# Patient Record
Sex: Male | Born: 1935 | Race: White | Hispanic: No | Marital: Married | Smoking: Never smoker
Health system: Southern US, Community
[De-identification: ages and names within clinical notes are randomized; demographics above are authoritative.]

## PROBLEM LIST (undated history)

## (undated) DIAGNOSIS — I10 Essential (primary) hypertension: Secondary | ICD-10-CM

## (undated) HISTORY — DX: Essential (primary) hypertension: I10

---

## 2014-12-25 DIAGNOSIS — I10 Essential (primary) hypertension: Secondary | ICD-10-CM | POA: Diagnosis not present

## 2015-06-26 DIAGNOSIS — Z79899 Other long term (current) drug therapy: Secondary | ICD-10-CM | POA: Diagnosis not present

## 2015-06-26 DIAGNOSIS — Z125 Encounter for screening for malignant neoplasm of prostate: Secondary | ICD-10-CM | POA: Diagnosis not present

## 2015-06-26 DIAGNOSIS — I10 Essential (primary) hypertension: Secondary | ICD-10-CM | POA: Diagnosis not present

## 2016-10-04 ENCOUNTER — Encounter: Payer: Self-pay | Admitting: General Practice

## 2016-11-22 ENCOUNTER — Ambulatory Visit: Payer: Self-pay | Admitting: Family Medicine

## 2016-12-19 ENCOUNTER — Encounter: Payer: Self-pay | Admitting: Family Medicine

## 2016-12-19 ENCOUNTER — Ambulatory Visit (INDEPENDENT_AMBULATORY_CARE_PROVIDER_SITE_OTHER): Payer: Medicare HMO | Admitting: Family Medicine

## 2016-12-19 VITALS — BP 121/79 | HR 79 | Temp 97.9°F | Resp 16 | Ht 72.75 in | Wt 192.1 lb

## 2016-12-19 DIAGNOSIS — Z125 Encounter for screening for malignant neoplasm of prostate: Secondary | ICD-10-CM | POA: Diagnosis not present

## 2016-12-19 DIAGNOSIS — E663 Overweight: Secondary | ICD-10-CM

## 2016-12-19 DIAGNOSIS — Z1211 Encounter for screening for malignant neoplasm of colon: Secondary | ICD-10-CM | POA: Diagnosis not present

## 2016-12-19 DIAGNOSIS — I10 Essential (primary) hypertension: Secondary | ICD-10-CM | POA: Diagnosis not present

## 2016-12-19 LAB — BASIC METABOLIC PANEL
BUN: 30 mg/dL — ABNORMAL HIGH (ref 6–23)
CALCIUM: 9.3 mg/dL (ref 8.4–10.5)
CO2: 29 meq/L (ref 19–32)
Chloride: 104 mEq/L (ref 96–112)
Creatinine, Ser: 1.12 mg/dL (ref 0.40–1.50)
GFR: 66.81 mL/min (ref >60.00)
Glucose, Bld: 80 mg/dL (ref 70–99)
POTASSIUM: 5 meq/L (ref 3.5–5.1)
SODIUM: 138 meq/L (ref 135–145)

## 2016-12-19 LAB — LIPID PANEL
Cholesterol: 216 mg/dL — ABNORMAL HIGH (ref 0–200)
HDL: 45.7 mg/dL (ref >39.00)
LDL Cholesterol: 138 mg/dL — ABNORMAL HIGH (ref 0–99)
NONHDL: 170.72
Total CHOL/HDL Ratio: 5
Triglycerides: 164 mg/dL — ABNORMAL HIGH (ref 0.0–149.0)
VLDL: 32.8 mg/dL (ref 0.0–40.0)

## 2016-12-19 LAB — HEPATIC FUNCTION PANEL
ALT: 13 U/L (ref 0–53)
AST: 15 U/L (ref 0–37)
Albumin: 4.3 g/dL (ref 3.5–5.2)
Alkaline Phosphatase: 67 U/L (ref 39–117)
BILIRUBIN DIRECT: 0.1 mg/dL (ref 0.0–0.3)
BILIRUBIN TOTAL: 0.4 mg/dL (ref 0.2–1.2)
Total Protein: 7.1 g/dL (ref 6.0–8.3)

## 2016-12-19 LAB — CBC WITH DIFFERENTIAL/PLATELET
Basophils Absolute: 0 10*3/uL (ref 0.0–0.1)
Basophils Relative: 0.6 % (ref 0.0–3.0)
Eosinophils Absolute: 0.2 10*3/uL (ref 0.0–0.7)
Eosinophils Relative: 3.6 % (ref 0.0–5.0)
HEMATOCRIT: 43.7 % (ref 39.0–52.0)
Hemoglobin: 14.3 g/dL (ref 13.0–17.0)
LYMPHS PCT: 31.9 % (ref 12.0–46.0)
Lymphs Abs: 1.7 10*3/uL (ref 0.7–4.0)
MCHC: 32.8 g/dL (ref 30.0–36.0)
MCV: 89.7 fl (ref 78.0–100.0)
MONOS PCT: 8.5 % (ref 3.0–12.0)
Monocytes Absolute: 0.5 10*3/uL (ref 0.1–1.0)
NEUTROS ABS: 3 10*3/uL (ref 1.4–7.7)
Neutrophils Relative %: 55.4 % (ref 43.0–77.0)
PLATELETS: 224 10*3/uL (ref 150.0–400.0)
RBC: 4.87 Mil/uL (ref 4.22–5.81)
RDW: 13.7 % (ref 11.5–15.5)
WBC: 5.3 10*3/uL (ref 4.0–10.5)

## 2016-12-19 LAB — TSH: TSH: 1.84 u[IU]/mL (ref 0.35–4.50)

## 2016-12-19 LAB — PSA, MEDICARE: PSA: 4.11 ng/ml — ABNORMAL HIGH (ref 0.10–4.00)

## 2016-12-19 NOTE — Assessment & Plan Note (Signed)
New to provider, ongoing for pt.  BMI is just over 25.  Exercising regularly.  Not following particular diet.  Check labs to risk stratify.  Will follow.

## 2016-12-19 NOTE — Progress Notes (Signed)
   Subjective:    Patient ID: Jack Randolph, male    DOB: 1932-09-14, 83 y.o.   MRN: [id number]  HPI New to establish.  Previous MD- Deboraha Sprang, Cammie Fulp  HTN- chronic problem, currently on Ramipril.  Has not been seen in 2 yrs.  BP is well controlled.  No CP, SOB, HAs, visual changes, edema.  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  Goes to gym ([employer]) 3x/week.  Not following any particular diet.  Health Maintenance- pt has never had a colonoscopy and is not interested in having one.  Pt is open to the idea of cologuard.  Pt reports he has had both Pneumonia vaccines at [facility].     Review of Systems For ROS see HPI     Objective:   Physical Exam  Constitutional: He is oriented to person, place, and time. He appears well-developed and well-nourished. No distress.  HENT:  Head: Normocephalic and atraumatic.  Eyes: Conjunctivae and EOM are normal. Pupils are equal, round, and reactive to light.  Neck: Normal range of motion. Neck supple. No thyromegaly present.  Cardiovascular: Normal rate, regular rhythm, normal heart sounds and intact distal pulses.   No murmur heard. Pulmonary/Chest: Effort normal and breath sounds normal. No respiratory distress.  Abdominal: Soft. Bowel sounds are normal. He exhibits no distension.  Musculoskeletal: He exhibits edema (trace edema of LEs bilaterally).  Lymphadenopathy:    He has no cervical adenopathy.  Neurological: He is alert and oriented to person, place, and time. No cranial nerve deficit.  Skin: Skin is warm and dry.  Psychiatric: He has a normal mood and affect. His behavior is normal.  Vitals reviewed.         Assessment & Plan:

## 2016-12-19 NOTE — Patient Instructions (Signed)
Schedule your complete physical in 6 months and your Medicare Wellness Visit w/ Jack Randolph around the same time [facility] notify you of your lab results and make any changes if needed Keep up the good work on healthy diet and regular exercise- you look great! Complete the Cologuard as directed Call with any questions or concerns Welcome!  We're glad to have you!!!

## 2016-12-19 NOTE — Assessment & Plan Note (Signed)
New to provider, ongoing for pt.  Well controlled on Ramipril.  Asymptomatic w/ exception of chronic LE edema.  Check labs.  No anticipated med changes.  Will follow.

## 2016-12-19 NOTE — Progress Notes (Signed)
Pre visit review using our clinic review tool, if applicable. No additional management support is needed unless otherwise documented below in the visit note. 

## 2016-12-19 NOTE — Assessment & Plan Note (Signed)
New.  Pt is not interested in colonoscopy but states he would pursue treatment if there was something found.  Will do cologuard instead.  Pt expressed understanding and is in agreement w/ plan.

## 2016-12-20 ENCOUNTER — Other Ambulatory Visit: Payer: Self-pay | Admitting: Family Medicine

## 2016-12-20 DIAGNOSIS — R972 Elevated prostate specific antigen [PSA]: Secondary | ICD-10-CM

## 2017-01-01 DIAGNOSIS — Z1212 Encounter for screening for malignant neoplasm of rectum: Secondary | ICD-10-CM | POA: Diagnosis not present

## 2017-01-01 DIAGNOSIS — Z1211 Encounter for screening for malignant neoplasm of colon: Secondary | ICD-10-CM | POA: Diagnosis not present

## 2017-01-03 LAB — COLOGUARD: COLOGUARD: NEGATIVE

## 2017-01-10 ENCOUNTER — Encounter: Payer: Self-pay | Admitting: General Practice

## 2017-01-31 ENCOUNTER — Other Ambulatory Visit: Payer: Self-pay | Admitting: Emergency Medicine

## 2017-01-31 ENCOUNTER — Telehealth: Payer: Self-pay | Admitting: Family Medicine

## 2017-01-31 MED ORDER — RAMIPRIL 10 MG PO CAPS: 10.0000 mg | ORAL_CAPSULE | Freq: Every day | ORAL | 1 refills | Status: DC

## 2017-01-31 NOTE — Telephone Encounter (Signed)
Rx sent to the preferred patient pharmacy  

## 2017-01-31 NOTE — Telephone Encounter (Signed)
Pt needs refill on ramipril 10mg  capsule, [facility] on [address].

## 2017-02-27 DIAGNOSIS — N5201 Erectile dysfunction due to arterial insufficiency: Secondary | ICD-10-CM | POA: Diagnosis not present

## 2017-02-27 DIAGNOSIS — N400 Enlarged prostate without lower urinary tract symptoms: Secondary | ICD-10-CM | POA: Diagnosis not present

## 2017-06-13 NOTE — Progress Notes (Addendum)
Subjective:   Jack Randolph is a 83 y.o. male who presents for an Initial Medicare Annual Wellness Visit.  Review of Systems  No ROS.  Medicare Wellness Visit. Additional risk factors are reflected in the social history.  Cardiac Risk Factors include: advanced age (>89men, >3 women);hypertension;male gender   Sleep patterns: Sleeps 11 hours.  Home Safety/Smoke Alarms: Feels safe in home. Smoke alarms in place.  Living environment; residence and Firearm Safety: Lives with wife in 2 story home.  Seat Belt Safety/Bike Helmet: Wears seat belt.    Male:   CCS-Cologuard 01/03/17, negative.      PSA-Followed by [facility] per patient Lab Results  Component Value Date   PSA 4.11 (H) 12/19/2016      Objective:    Today's Vitals   06/14/17 0818  BP: 121/80  Pulse: (!) 57  Resp: 16  Temp: 98 F (36.7 C)  TempSrc: Oral  SpO2: 98%  Weight: 188 lb (85.3 kg)  Height: 6' (1.829 m)   Body mass index is 25.5 kg/m.  Current Medications (verified) Outpatient Encounter Prescriptions as of 06/14/2017  Medication Sig  . Coenzyme Q10 (CO Q 10) 100 MG CAPS Take by mouth.  Boris Lown Oil (OMEGA-3) 500 MG CAPS Take by mouth.  . Multiple Vitamins-Minerals (MULTIVITAMIN ADULTS PO) Take by mouth.  . ramipril (ALTACE) 10 MG capsule Take 1 capsule (10 mg total) by mouth daily.   No facility-administered encounter medications on file as of 06/14/2017.     Allergies (verified) Patient has no known allergies.   History: Past Medical History:  Diagnosis Date  . Hypertension    History reviewed. No pertinent surgical history. Family History  Problem Relation Age of Onset  . Healthy Mother   . COPD Father    Social History   Occupational History  . Holiday representative    Social History Main Topics  . Smoking status: Never Smoker  . Smokeless tobacco: Never Used  . Alcohol use Yes  . Drug use: No  . Sexual activity: Not on file   Tobacco Counseling Counseling given:  Yes   Activities of Daily Living In your present state of health, do you have any difficulty performing the following activities: 06/14/2017 06/14/2017  Hearing? N N  Vision? N N  Difficulty concentrating or making decisions? N N  Walking or climbing stairs? N N  Dressing or bathing? N N  Doing errands, shopping? N N  Preparing Food and eating ? N -  Using the Toilet? N -  In the past six months, have you accidently leaked urine? N -  Do you have problems with loss of bowel control? N -  Managing your Medications? N -  Managing your Finances? N -  Housekeeping or managing your Housekeeping? N -  Some recent data might be hidden    Immunizations and Health Maintenance Immunization History  Administered Date(s) Administered  . Influenza,inj,Quad PF,6+ Mos 04/11/2017, 04/11/2017  . Pneumococcal Conjugate-13 04/17/2015  . Pneumococcal Polysaccharide-23 03/22/2016   There are no preventive care reminders to display for this patient.  Patient Care Team: Sheliah Hatch, MD as PCP - General (Family Medicine) [facility], [facility]  Indicate any recent Medical Services you may have received from other than [facility] providers in the past year (date may be approximate).    Assessment:   This is a routine wellness examination for Jack Randolph. Physical assessment deferred to PCP.   Hearing/Vision screen Hearing Screening Comments: Able to hear conversational tones w/o  difficulty. No issues reported.   Vision Screening Comments: Last exam > 4 years, wears glasses.   Dietary issues and exercise activities discussed: Current Exercise Habits: Home exercise routine, Type of exercise: walking, Time (Minutes): 50, Frequency (Times/Week): 2, Weekly Exercise (Minutes/Week): 100, Exercise limited by: None identified   Diet (meal preparation, eat out, water intake, caffeinated beverages, dairy products, fruits and vegetables): Drinks water and juice.   Breakfast: eggs, juice Lunch:  casseroles; protein and vegetables.  Dinner: protein and vegetables.   Goals    . patient          Maintain current health by staying active.       Depression Screen PHQ 2/9 Scores 06/14/2017 06/14/2017 12/19/2016  PHQ - 2 Score 0 0 0  PHQ- 9 Score - 0 0    Fall Risk Fall Risk  06/14/2017 06/14/2017 12/19/2016  Falls in the past year? No No No    Cognitive Function: MMSE - Mini Mental State Exam 06/14/2017  Orientation to time 5  Orientation to Place 5  Registration 3  Attention/ Calculation 3  Recall 3  Language- name 2 objects 2  Language- repeat 1  Language- follow 3 step command 3  Language- read & follow direction 1  Write a sentence 1  Copy design 1  Total score 28        Screening Tests Health Maintenance  Topic Date Due  . TETANUS/TDAP  11/20/2017 (Originally 07/27/1952)  . INFLUENZA VACCINE  Completed  . PNA vac Low Risk Adult  Completed        Plan:    Bring a copy of your living will and/or healthcare power of attorney to your next office visit.  Continue doing brain stimulating activities (puzzles, reading, adult coloring books, staying active) to keep memory sharp.   I have personally reviewed and noted the following in the patient's chart:   . Medical and social history . Use of alcohol, tobacco or illicit drugs  . Current medications and supplements . Functional ability and status . Nutritional status . Physical activity . Advanced directives . List of other physicians . Hospitalizations, surgeries, and ER visits in previous 12 months . Vitals . Screenings to include cognitive, depression, and falls . Referrals and appointments  In addition, I have reviewed and discussed with patient certain preventive protocols, quality metrics, and best practice recommendations. A written personalized care plan for preventive services as well as general preventive health recommendations were provided to patient.     Alysia PennaKimberly R Kateland Leisinger,  RN   06/14/2017   PCP Notes: -Declines Shingrix at this time -MMSE=28  Reviewed documentation and agree w/ above.  Neena RhymesKatherine Tabori, MD

## 2017-06-14 ENCOUNTER — Encounter: Payer: Self-pay | Admitting: Family Medicine

## 2017-06-14 ENCOUNTER — Ambulatory Visit (INDEPENDENT_AMBULATORY_CARE_PROVIDER_SITE_OTHER): Payer: Medicare HMO | Admitting: Family Medicine

## 2017-06-14 VITALS — BP 121/80 | HR 57 | Temp 98.0°F | Resp 16 | Ht 72.0 in | Wt 188.0 lb

## 2017-06-14 DIAGNOSIS — Z0000 Encounter for general adult medical examination without abnormal findings: Secondary | ICD-10-CM | POA: Diagnosis not present

## 2017-06-14 DIAGNOSIS — I10 Essential (primary) hypertension: Secondary | ICD-10-CM

## 2017-06-14 DIAGNOSIS — Z125 Encounter for screening for malignant neoplasm of prostate: Secondary | ICD-10-CM

## 2017-06-14 LAB — CBC WITH DIFFERENTIAL/PLATELET
BASOS ABS: 0 10*3/uL (ref 0.0–0.1)
Basophils Relative: 0.6 % (ref 0.0–3.0)
Eosinophils Absolute: 0.2 10*3/uL (ref 0.0–0.7)
Eosinophils Relative: 3.7 % (ref 0.0–5.0)
HCT: 42 % (ref 39.0–52.0)
Hemoglobin: 14.2 g/dL (ref 13.0–17.0)
LYMPHS ABS: 1.9 10*3/uL (ref 0.7–4.0)
Lymphocytes Relative: 34.5 % (ref 12.0–46.0)
MCHC: 33.7 g/dL (ref 30.0–36.0)
MCV: 89.7 fl (ref 78.0–100.0)
MONO ABS: 0.4 10*3/uL (ref 0.1–1.0)
Monocytes Relative: 8 % (ref 3.0–12.0)
NEUTROS ABS: 2.9 10*3/uL (ref 1.4–7.7)
NEUTROS PCT: 53.2 % (ref 43.0–77.0)
Platelets: 215 10*3/uL (ref 150.0–400.0)
RBC: 4.68 Mil/uL (ref 4.22–5.81)
RDW: 13.5 % (ref 11.5–15.5)
WBC: 5.4 10*3/uL (ref 4.0–10.5)

## 2017-06-14 LAB — LIPID PANEL
CHOLESTEROL: 243 mg/dL — AB (ref 0–200)
HDL: 48.9 mg/dL (ref >39.00)
LDL CALC: 165 mg/dL — AB (ref 0–99)
NonHDL: 193.93
TRIGLYCERIDES: 145 mg/dL (ref 0.0–149.0)
Total CHOL/HDL Ratio: 5
VLDL: 29 mg/dL (ref 0.0–40.0)

## 2017-06-14 LAB — BASIC METABOLIC PANEL
BUN: 30 mg/dL — ABNORMAL HIGH (ref 6–23)
CHLORIDE: 103 meq/L (ref 96–112)
CO2: 30 meq/L (ref 19–32)
Calcium: 9.2 mg/dL (ref 8.4–10.5)
Creatinine, Ser: 0.91 mg/dL (ref 0.40–1.50)
GFR: 84.8 mL/min (ref >60.00)
GLUCOSE: 99 mg/dL (ref 70–99)
POTASSIUM: 4.6 meq/L (ref 3.5–5.1)
Sodium: 139 mEq/L (ref 135–145)

## 2017-06-14 LAB — HEPATIC FUNCTION PANEL
ALK PHOS: 70 U/L (ref 39–117)
ALT: 12 U/L (ref 0–53)
AST: 14 U/L (ref 0–37)
Albumin: 4.1 g/dL (ref 3.5–5.2)
Bilirubin, Direct: 0.1 mg/dL (ref 0.0–0.3)
TOTAL PROTEIN: 6.8 g/dL (ref 6.0–8.3)
Total Bilirubin: 0.5 mg/dL (ref 0.2–1.2)

## 2017-06-14 LAB — TSH: TSH: 1.45 u[IU]/mL (ref 0.35–4.50)

## 2017-06-14 LAB — PSA, MEDICARE: PSA: 4.16 ng/ml — ABNORMAL HIGH (ref 0.10–4.00)

## 2017-06-14 NOTE — Progress Notes (Signed)
Called pt and lmovm to return call.

## 2017-06-14 NOTE — Progress Notes (Signed)
   Subjective:    Patient ID: Jack Randolph, male    DOB: 03/17/1933, 83 y.o.   MRN: [id number]  HPI CPE- UTD on flu shot, pneumonia vaccines.  UTD on cologuard.  Pt has gained 5 lbs from last visit.   Review of Systems Patient reports no vision/hearing changes, anorexia, fever ,adenopathy, persistant/recurrent hoarseness, swallowing issues, chest pain, palpitations, edema, persistant/recurrent cough, hemoptysis, dyspnea (rest,exertional, paroxysmal nocturnal), gastrointestinal  bleeding (melena, rectal bleeding), abdominal pain, excessive heart burn, GU symptoms (dysuria, hematuria, voiding/incontinence issues) syncope, focal weakness, memory loss, numbness & tingling, skin/hair/nail changes, depression, anxiety, abnormal bruising/bleeding, musculoskeletal symptoms/signs.     Objective:   Physical Exam General Appearance:    Alert, cooperative, no distress, appears stated age  Head:    Normocephalic, without obvious abnormality, atraumatic  Eyes:    PERRL, conjunctiva/corneas clear, EOM's intact, fundi    benign, both eyes       Ears:    Normal TM's and external ear canals, both ears  Nose:   Nares normal, septum midline, mucosa normal, no drainage   or sinus tenderness  Throat:   Lips, mucosa, and tongue normal; teeth and gums normal  Neck:   Supple, symmetrical, trachea midline, no adenopathy;       thyroid:  No enlargement/tenderness/nodules  Back:     Symmetric, no curvature, ROM normal, no CVA tenderness  Lungs:     Clear to auscultation bilaterally, respirations unlabored  Chest wall:    No tenderness or deformity  Heart:    Regular rate and rhythm, S1 and S2 normal, no murmur, rub   or gallop  Abdomen:     Soft, non-tender, bowel sounds active all four quadrants,    no masses, no organomegaly  Genitalia:    deferred  Rectal:    Extremities:   Extremities normal, atraumatic, no cyanosis or edema  Pulses:   2+ and symmetric all extremities  Skin:   Skin color, texture,  turgor normal, no rashes or lesions  Lymph nodes:   Cervical, supraclavicular, and axillary nodes normal  Neurologic:   CNII-XII intact. Normal strength, sensation and reflexes      throughout          Assessment & Plan:

## 2017-06-14 NOTE — Assessment & Plan Note (Signed)
Pt's PE WNL.  UTD on cologuard, immunizations.  Check PSA today.  Check labs.  Anticipatory guidance provided.

## 2017-06-14 NOTE — Assessment & Plan Note (Signed)
Chronic problem.  Adequate control.  Asymptomatic.  Check labs.  Will continue to follow.

## 2017-06-14 NOTE — Patient Instructions (Addendum)
Follow up in 6 months to recheck BP We'll notify you of your lab results and make any changes if needed Continue to work on healthy diet and regular exercise- you look great! Call with any questions or concerns Happy Fall!!!  Bring a copy of your living will and/or healthcare power of attorney to your next office visit.  Continue doing brain stimulating activities (puzzles, reading, adult coloring books, staying active) to keep memory sharp.    Health Maintenance, Male A healthy lifestyle and preventive care is important for your health and wellness. Ask your health care provider about what schedule of regular examinations is right for you. What should I know about weight and diet? Eat a Healthy Diet  Eat plenty of vegetables, fruits, whole grains, low-fat dairy products, and lean protein.  Do not eat a lot of foods high in solid fats, added sugars, or salt.  Maintain a Healthy Weight Regular exercise can help you achieve or maintain a healthy weight. You should:  Do at least 150 minutes of exercise each week. The exercise should increase your heart rate and make you sweat (moderate-intensity exercise).  Do strength-training exercises at least twice a week.  Watch Your Levels of Cholesterol and Blood Lipids  Have your blood tested for lipids and cholesterol every 5 years starting at 81 years of age. If you are at high risk for heart disease, you should start having your blood tested when you are 81 years old. You may need to have your cholesterol levels checked more often if: ? Your lipid or cholesterol levels are high. ? You are older than 81 years of age. ? You are at high risk for heart disease.  What should I know about cancer screening? Many types of cancers can be detected early and may often be prevented. Lung Cancer  You should be screened every year for lung cancer if: ? You are a current smoker who has smoked for at least 30 years. ? You are a former smoker who has  quit within the past 15 years.  Talk to your health care provider about your screening options, when you should start screening, and how often you should be screened.  Colorectal Cancer  Routine colorectal cancer screening usually begins at 81 years of age and should be repeated every 5-10 years until you are 81 years old. You may need to be screened more often if early forms of precancerous polyps or small growths are found. Your health care provider may recommend screening at an earlier age if you have risk factors for colon cancer.  Your health care provider may recommend using home test kits to check for hidden blood in the stool.  A small camera at the end of a tube can be used to examine your colon (sigmoidoscopy or colonoscopy). This checks for the earliest forms of colorectal cancer.  Prostate and Testicular Cancer  Depending on your age and overall health, your health care provider may do certain tests to screen for prostate and testicular cancer.  Talk to your health care provider about any symptoms or concerns you have about testicular or prostate cancer.  Skin Cancer  Check your skin from head to toe regularly.  Tell your health care provider about any new moles or changes in moles, especially if: ? There is a change in a mole's size, shape, or color. ? You have a mole that is larger than a pencil eraser.  Always use sunscreen. Apply sunscreen liberally and repeat throughout the day.  Protect yourself by wearing long sleeves, pants, a wide-brimmed hat, and sunglasses when outside.  What should I know about heart disease, diabetes, and high blood pressure?  If you are 53-44 years of age, have your blood pressure checked every 3-5 years. If you are 70 years of age or older, have your blood pressure checked every year. You should have your blood pressure measured twice-once when you are at a hospital or clinic, and once when you are not at a hospital or clinic. Record the  average of the two measurements. To check your blood pressure when you are not at a hospital or clinic, you can use: ? An automated blood pressure machine at a pharmacy. ? A home blood pressure monitor.  Talk to your health care provider about your target blood pressure.  If you are between 96-96 years old, ask your health care provider if you should take aspirin to prevent heart disease.  Have regular diabetes screenings by checking your fasting blood sugar level. ? If you are at a normal weight and have a low risk for diabetes, have this test once every three years after the age of 87. ? If you are overweight and have a high risk for diabetes, consider being tested at a younger age or more often.  A one-time screening for abdominal aortic aneurysm (AAA) by ultrasound is recommended for men aged 70-75 years who are current or former smokers. What should I know about preventing infection? Hepatitis B If you have a higher risk for hepatitis B, you should be screened for this virus. Talk with your health care provider to find out if you are at risk for hepatitis B infection. Hepatitis C Blood testing is recommended for:  Everyone born from [birth year] through 1963.  Anyone with known risk factors for hepatitis C.  Sexually Transmitted Diseases (STDs)  You should be screened each year for STDs including gonorrhea and chlamydia if: ? You are sexually active and are younger than 81 years of age. ? You are older than 81 years of age and your health care provider tells you that you are at risk for this type of infection. ? Your sexual activity has changed since you were last screened and you are at an increased risk for chlamydia or gonorrhea. Ask your health care provider if you are at risk.  Talk with your health care provider about whether you are at high risk of being infected with HIV. Your health care provider may recommend a prescription medicine to help prevent HIV infection.  What else can  I do?  Schedule regular health, dental, and eye exams.  Stay current with your vaccines (immunizations).  Do not use any tobacco products, such as cigarettes, chewing tobacco, and e-cigarettes. If you need help quitting, ask your health care provider.  Limit alcohol intake to no more than 2 drinks per day. One drink equals 12 ounces of beer, 5 ounces of wine, or 1 ounces of hard liquor.  Do not use street drugs.  Do not share needles.  Ask your health care provider for help if you need support or information about quitting drugs.  Tell your health care provider if you often feel depressed.  Tell your health care provider if you have ever been abused or do not feel safe at home. This information is not intended to replace advice given to you by your health care provider. Make sure you discuss any questions you have with your health care provider. Document Released: 02/04/2008 Document  Revised: 04/06/2016 Document Reviewed: 05/12/2015 Elsevier Interactive Patient Education  [employer].

## 2017-09-26 DIAGNOSIS — N400 Enlarged prostate without lower urinary tract symptoms: Secondary | ICD-10-CM | POA: Diagnosis not present

## 2017-09-26 DIAGNOSIS — N390 Urinary tract infection, site not specified: Secondary | ICD-10-CM | POA: Diagnosis not present

## 2017-09-27 ENCOUNTER — Other Ambulatory Visit: Payer: Self-pay

## 2017-09-27 ENCOUNTER — Emergency Department (HOSPITAL_COMMUNITY)
Admission: EM | Admit: 2017-09-27 | Discharge: 2017-09-27 | Disposition: A | Discharge status: Home / Self Care | Payer: Medicare HMO | Attending: Emergency Medicine | Admitting: Emergency Medicine

## 2017-09-27 ENCOUNTER — Encounter (HOSPITAL_COMMUNITY): Payer: Self-pay

## 2017-09-27 DIAGNOSIS — I10 Essential (primary) hypertension: Secondary | ICD-10-CM | POA: Diagnosis not present

## 2017-09-27 DIAGNOSIS — R339 Retention of urine, unspecified: Secondary | ICD-10-CM | POA: Diagnosis not present

## 2017-09-27 DIAGNOSIS — N3000 Acute cystitis without hematuria: Secondary | ICD-10-CM | POA: Diagnosis not present

## 2017-09-27 DIAGNOSIS — Z79899 Other long term (current) drug therapy: Secondary | ICD-10-CM | POA: Diagnosis not present

## 2017-09-27 DIAGNOSIS — R300 Dysuria: Secondary | ICD-10-CM | POA: Diagnosis present

## 2017-09-27 DIAGNOSIS — R1030 Lower abdominal pain, unspecified: Secondary | ICD-10-CM | POA: Diagnosis not present

## 2017-09-27 LAB — BASIC METABOLIC PANEL
Anion gap: 12 (ref 5–15)
BUN: 25 mg/dL — AB (ref 6–20)
CO2: 21 mmol/L — ABNORMAL LOW (ref 22–32)
CREATININE: 1.02 mg/dL (ref 0.61–1.24)
Calcium: 8.2 mg/dL — ABNORMAL LOW (ref 8.9–10.3)
Chloride: 93 mmol/L — ABNORMAL LOW (ref 101–111)
Glucose, Bld: 120 mg/dL — ABNORMAL HIGH (ref 65–99)
POTASSIUM: 4 mmol/L (ref 3.5–5.1)
SODIUM: 126 mmol/L — AB (ref 135–145)

## 2017-09-27 LAB — CBC
HCT: 37.2 % — ABNORMAL LOW (ref 39.0–52.0)
Hemoglobin: 12.5 g/dL — ABNORMAL LOW (ref 13.0–17.0)
MCH: 29.6 pg (ref 26.0–34.0)
MCHC: 33.6 g/dL (ref 30.0–36.0)
MCV: 88.2 fL (ref 78.0–100.0)
PLATELETS: 197 10*3/uL (ref 150–400)
RBC: 4.22 MIL/uL (ref 4.22–5.81)
RDW: 13.6 % (ref 11.5–15.5)
WBC: 10.2 10*3/uL (ref 4.0–10.5)

## 2017-09-27 LAB — URINALYSIS, ROUTINE W REFLEX MICROSCOPIC
BILIRUBIN URINE: NEGATIVE
Glucose, UA: NEGATIVE mg/dL
Hgb urine dipstick: NEGATIVE
KETONES UR: 5 mg/dL — AB
Nitrite: POSITIVE — AB
PH: 6 (ref 5.0–8.0)
PROTEIN: 30 mg/dL — AB
SQUAMOUS EPITHELIAL / LPF: NONE SEEN
Specific Gravity, Urine: 1.018 (ref 1.005–1.030)

## 2017-09-27 LAB — I-STAT CG4 LACTIC ACID, ED: Lactic Acid, Venous: 1.62 mmol/L (ref 0.5–1.9)

## 2017-09-27 MED ORDER — SODIUM CHLORIDE 0.9 % IV BOLUS (SEPSIS)
1000.0000 mL | Freq: Once | INTRAVENOUS | Status: AC
  Administered 2017-09-27: 1000 mL via INTRAVENOUS

## 2017-09-27 MED ORDER — OXYCODONE-ACETAMINOPHEN 5-325 MG PO TABS
1.0000 | ORAL_TABLET | ORAL | Status: DC | PRN
  Administered 2017-09-27: 1 via ORAL
  Filled 2017-09-27: qty 1

## 2017-09-27 MED ORDER — DEXTROSE 5 % IV SOLN
1.0000 g | Freq: Once | INTRAVENOUS | Status: AC
  Administered 2017-09-27: 1 g via INTRAVENOUS
  Filled 2017-09-27: qty 10

## 2017-09-27 MED ORDER — CEPHALEXIN 500 MG PO CAPS: 500.0000 mg | ORAL_CAPSULE | Freq: Three times a day (TID) | ORAL | 0 refills | Status: DC

## 2017-09-27 NOTE — Discharge Instructions (Signed)
Take antibiotics as prescribed until all gone.  Please follow-up with urology, call today or tomorrow to get an appointment for follow-up.  Return if any problems.

## 2017-09-27 NOTE — ED Triage Notes (Signed)
Pt reports he went to UC yesterday and was diagnosed with UTI, placed on cipro, pt reports that he has already taken 10 of the antibiotics since yesterday to try and help the pain. Denies fevers, still having frequency, dysuria and unable to empty his bladder.

## 2017-09-27 NOTE — ED Provider Notes (Signed)
Jack Randolph [facility] EMERGENCY DEPARTMENT Provider Note   CSN: [id number] Arrival date & time: 09/27/17  2130     History   Chief Complaint Chief Complaint  Patient presents with  . Urinary Frequency  . Drug Overdose    HPI Jack Randolph is a 84 y.o. male.  HPI Jack Randolph is a 84 y.o. male with history of hypertension, presents to emergency department complaining of dysuria, urinary frequency, urgency, lower abdominal discomfort.  He states his symptoms began 4 days ago.  He went to urgent care yesterday and was prescribed ciprofloxacin.  He states he took all of the ciprofloxacin over the last 24 hours, total of 10 tablets, because he was not improving.  He states he has constant urgency to go urinate, however sometimes nothing comes out and sometimes a small amount comes out with a lot of burning.  Denies blood in his urine.  Denies any flank pain or lower back pain.  Denies any nausea or vomiting.  No fever.  No other complaints.  Past Medical History:  Diagnosis Date  . Hypertension     Patient Active Problem List   Diagnosis Date Noted  . Physical exam 06/14/2017  . HTN (hypertension) 12/19/2016  . Overweight (BMI 25.0-29.9) 12/19/2016  . Colon cancer screening 12/19/2016    History reviewed. No pertinent surgical history.     Home Medications    Prior to Admission medications   Medication Sig Start Date End Date Taking? Authorizing Provider  Coenzyme Q10 (CO Q 10) 100 MG CAPS Take by mouth.    [provider]  Providence Lanius (OMEGA-3) 500 MG CAPS Take by mouth.    [provider]  Multiple Vitamins-Minerals (MULTIVITAMIN ADULTS PO) Take by mouth.    [provider]  ramipril (ALTACE) 10 MG capsule Take 1 capsule (10 mg total) by mouth daily. 01/31/17   Sheliah Hatch, MD    Family History Family History  Problem Relation Age of Onset  . Healthy Mother   . COPD Father     Social History Social History     Tobacco Use  . Smoking status: Never Smoker  . Smokeless tobacco: Never Used  Substance Use Topics  . Alcohol use: Yes  . Drug use: No     Allergies   Patient has no known allergies.   Review of Systems Review of Systems  Constitutional: Negative for chills and fever.  Respiratory: Negative for cough, chest tightness and shortness of breath.   Cardiovascular: Negative for chest pain, palpitations and leg swelling.  Gastrointestinal: Positive for abdominal pain. Negative for abdominal distention, diarrhea, nausea and vomiting.  Genitourinary: Positive for difficulty urinating, dysuria and frequency. Negative for flank pain, hematuria, scrotal swelling, testicular pain and urgency.  Musculoskeletal: Negative for arthralgias, myalgias, neck pain and neck stiffness.  Skin: Negative for rash.  Allergic/Immunologic: Negative for immunocompromised state.  Neurological: Negative for dizziness, weakness, light-headedness, numbness and headaches.  All other systems reviewed and are negative.    Physical Exam Updated Vital Signs BP (!) 173/120   Pulse 72   Temp 98.4 F (36.9 C) (Oral)   Resp 18   SpO2 98%   Physical Exam  Constitutional: He appears well-developed and well-nourished. No distress.  HENT:  Head: Normocephalic and atraumatic.  Eyes: Conjunctivae are normal.  Neck: Neck supple.  Cardiovascular: Normal rate, regular rhythm and normal heart sounds.  Pulmonary/Chest: Effort normal. No respiratory distress. He has no wheezes. He has no rales.  Abdominal:  Soft. Bowel sounds are normal. He exhibits no distension. There is tenderness. There is no rebound.  Suprapubic tenderness  Genitourinary: Penis normal.  Musculoskeletal: He exhibits no edema.  Neurological: He is alert.  Skin: Skin is warm and dry.  Nursing note and vitals reviewed.    ED Treatments / Results  Labs (all labs ordered are listed, but only abnormal results are displayed) Labs Reviewed   URINALYSIS, ROUTINE W REFLEX MICROSCOPIC - Abnormal; Notable for the following components:      Result Value   APPearance CLOUDY (*)    Ketones, ur 5 (*)    Protein, ur 30 (*)    Nitrite POSITIVE (*)    Leukocytes, UA LARGE (*)    Bacteria, UA FEW (*)    All other components within normal limits  CBC - Abnormal; Notable for the following components:   Hemoglobin 12.5 (*)    HCT 37.2 (*)    All other components within normal limits  BASIC METABOLIC PANEL - Abnormal; Notable for the following components:   Sodium 126 (*)    Chloride 93 (*)    CO2 21 (*)    Glucose, Bld 120 (*)    BUN 25 (*)    Calcium 8.2 (*)    All other components within normal limits  URINE CULTURE  I-STAT CG4 LACTIC ACID, ED  I-STAT CG4 LACTIC ACID, ED    EKG  EKG Interpretation None       Radiology No results found.  Procedures BLADDER CATHETERIZATION Date/Time: 09/27/2017 3:17 PM Performed by: Jaynie CrumbleKirichenko, Alexarae Oliva, PA-C Authorized by: Jaynie CrumbleKirichenko, Elba Schaber, PA-C   Consent:    Consent obtained:  Verbal   Consent given by:  Patient   Risks discussed:  Infection, false passage and urethral injury   Alternatives discussed:  No treatment and delayed treatment Pre-procedure details:    Procedure purpose:  Therapeutic Anesthesia (see MAR for exact dosages):    Anesthesia method:  None Procedure details:    Catheter insertion:  Indwelling   Catheter type:  Foley   Catheter size:  16 Fr   Bladder irrigation: no     Number of attempts:  1   Urine characteristics:  Clear Post-procedure details:    Patient tolerance of procedure:  Tolerated well, no immediate complications   (including critical care time)  Medications Ordered in ED Medications  oxyCODONE-acetaminophen (PERCOCET/ROXICET) 5-325 MG per tablet 1 tablet (1 tablet Oral Given 09/27/17 0658)  sodium chloride 0.9 % bolus 1,000 mL (not administered)  cefTRIAXone (ROCEPHIN) 1 g in dextrose 5 % 50 mL IVPB (not administered)     Initial  Impression / Assessment and Plan / ED Course  I have reviewed the triage vital signs and the nursing notes.  Pertinent labs & imaging results that were available during my care of the patient were reviewed by me and considered in my medical decision making (see chart for details).      Patient patient with dysuria, frequency, urgency for the last 4 days.  Was seen at urgent care yesterday and diagnosed with a urinary tract infection.  He was given 10 tablets of ciprofloxacin which he took all within the last 24 hours with no relief of his symptoms.  Patient has no nausea, vomiting, fever, back pain or flank pain.  No scrotal pain or swelling.  Labs showing hyponatremia, slightly elevated BUN, normal creatinine.  Urine analysis positive for nitrates, large leukocyte esterase, too numerous to count white blood cells.  Will send culture of the  urine, will administer IV Rocephin and get post void residual.  I spoke with poison control, regarding his overdose on Cipro which was accidental, they advised to watch out for GI symptoms and to be well-hydrated.    3:13 PM Foley placed myself.  Greater than 1 L of urine out.  At this time total of 1700 mL's of urine in the Foley.  Given 1 g of Rocephin in the emergency department.  Home with Keflex, Foley bag, follow-up with urology.   Vitals:   09/27/17 1345 09/27/17 1400 09/27/17 1415 09/27/17 1430  BP: (!) 144/87 136/86 (!) 147/89 (!) 157/90  Pulse: 63 (!) 58 (!) 58 63  Resp:      Temp:      TempSrc:      SpO2: 97% 99% 100% 100%     Final Clinical Impressions(s) / ED Diagnoses   Final diagnoses:  Acute cystitis without hematuria  Urinary retention    ED Discharge Orders        Ordered    cephALEXin (KEFLEX) 500 MG capsule  3 times daily     09/27/17 1509       Jaynie Crumble, PA-C 09/27/17 1518    Gwyneth Sprout, MD 09/27/17 1556

## 2017-09-28 LAB — URINE CULTURE: Culture: NO GROWTH

## 2017-10-04 DIAGNOSIS — R339 Retention of urine, unspecified: Secondary | ICD-10-CM | POA: Diagnosis not present

## 2017-12-13 ENCOUNTER — Other Ambulatory Visit: Payer: Self-pay | Admitting: Family Medicine

## 2018-06-19 NOTE — Progress Notes (Deleted)
Subjective:   Jack Randolph is a 84 y.o. male who presents for Medicare Annual/Subsequent preventive examination.  Review of Systems:  No ROS.  Medicare Wellness Visit. Additional risk factors are reflected in the social history.    Sleep patterns:   Home Safety/Smoke Alarms: Feels safe in home. Smoke alarms in place.  Living environment; residence and Firearm Safety: Lives with wife in 2 story home.  Seat Belt Safety/Bike Helmet: Wears seat belt.    Male:   CCS-Cologuard 01/03/17, negative.         PSA-Followed by [facility]   Lab Results  Component Value Date   PSA 4.16 (H) 06/14/2017   PSA 4.11 (H) 12/19/2016       Objective:    Vitals: There were no vitals taken for this visit.  There is no height or weight on file to calculate BMI.  Advanced Directives 06/14/2017  Does Patient Have a Medical Advance Directive? Yes  Type of Advance Directive Living will;Healthcare Power of Attorney  Copy of Healthcare Power of Attorney in Chart? No - copy requested    Tobacco Social History   Tobacco Use  Smoking Status Never Smoker  Smokeless Tobacco Never Used     Counseling given: Not Answered   Past Medical History:  Diagnosis Date  . Hypertension    No past surgical history on file. Family History  Problem Relation Age of Onset  . Healthy Mother   . COPD Father    Social History   Socioeconomic History  . Marital status: Married    Spouse name: Not on file  . Number of children: 5  . Years of education: Not on file  . Highest education level: Not on file  Occupational History  . Occupation: Training and development officer  . Financial resource strain: Not on file  . Food insecurity:    Worry: Not on file    Inability: Not on file  . Transportation needs:    Medical: Not on file    Non-medical: Not on file  Tobacco Use  . Smoking status: Never Smoker  . Smokeless tobacco: Never Used  Substance and Sexual Activity  . Alcohol use: Yes  .  Drug use: No  . Sexual activity: Not on file  Lifestyle  . Physical activity:    Days per week: Not on file    Minutes per session: Not on file  . Stress: Not on file  Relationships  . Social connections:    Talks on phone: Not on file    Gets together: Not on file    Attends religious service: Not on file    Active member of club or organization: Not on file    Attends meetings of clubs or organizations: Not on file    Relationship status: Not on file  Other Topics Concern  . Not on file  Social History Narrative  . Not on file    Outpatient Encounter Medications as of 06/20/2018  Medication Sig  . cephALEXin (KEFLEX) 500 MG capsule Take 1 capsule (500 mg total) by mouth 3 (three) times daily.  . Coenzyme Q10 (CO Q 10) 100 MG CAPS Take by mouth.  Boris Lown Oil (OMEGA-3) 500 MG CAPS Take by mouth.  . Multiple Vitamins-Minerals (MULTIVITAMIN ADULTS PO) Take by mouth.  . ramipril (ALTACE) 10 MG capsule TAKE 1 CAPSULE BY MOUTH ONCE DAILY   No facility-administered encounter medications on file as of 06/20/2018.     Activities of Daily Living No flowsheet  data found.  Patient Care Team: Sheliah Hatch, MD as PCP - General (Family Medicine) [facility], [facility]   Assessment:   This is a routine wellness examination for Jack Randolph.  Exercise Activities and Dietary recommendations   Diet (meal preparation, eat out, water intake, caffeinated beverages, dairy products, fruits and vegetables):   Breakfast: Lunch:  Dinner:      Goals    . patient     Maintain current health by staying active.        Fall Risk Fall Risk  06/14/2017 06/14/2017 12/19/2016  Falls in the past year? No No No    Depression Screen PHQ 2/9 Scores 06/14/2017 06/14/2017 12/19/2016  PHQ - 2 Score 0 0 0  PHQ- 9 Score - 0 0    Cognitive Function MMSE - Mini Mental State Exam 06/14/2017  Orientation to time 5  Orientation to Place 5  Registration 3  Attention/ Calculation 3    Recall 3  Language- name 2 objects 2  Language- repeat 1  Language- follow 3 step command 3  Language- read & follow direction 1  Write a sentence 1  Copy design 1  Total score 28        Immunization History  Administered Date(s) Administered  . Influenza,inj,Quad PF,6+ Mos 04/11/2017, 04/11/2017  . Influenza-Unspecified 04/25/2018  . Pneumococcal Conjugate-13 04/17/2015  . Pneumococcal Polysaccharide-23 03/22/2016    Screening Tests Health Maintenance  Topic Date Due  . TETANUS/TDAP  07/27/1952  . INFLUENZA VACCINE  Completed  . PNA vac Low Risk Adult  Completed        Plan:     I have personally reviewed and noted the following in the patient's chart:   . Medical and social history . Use of alcohol, tobacco or illicit drugs  . Current medications and supplements . Functional ability and status . Nutritional status . Physical activity . Advanced directives . List of other physicians . Hospitalizations, surgeries, and ER visits in previous 12 months . Vitals . Screenings to include cognitive, depression, and falls . Referrals and appointments  In addition, I have reviewed and discussed with patient certain preventive protocols, quality metrics, and best practice recommendations. A written personalized care plan for preventive services as well as general preventive health recommendations were provided to patient.     Alysia Penna, RN  06/19/2018

## 2018-06-20 ENCOUNTER — Ambulatory Visit: Payer: Medicare HMO

## 2018-10-21 ENCOUNTER — Telehealth: Payer: Self-pay | Admitting: Family Medicine

## 2018-10-22 NOTE — Telephone Encounter (Signed)
Please call wife as to why the medicine was declined for refill   [phone]

## 2018-10-22 NOTE — Telephone Encounter (Signed)
Spoke with patients wife. The prescription shows that the medication (ALTACE) was written for 6 months, ending in October 2019. Mrs. Morenz informed me that her husband is "horrible at taking medications" and "only takes them when he feels that he needs them." I told her that I would send PCP a message to see if he needs to make an appt or PCP will refill for 2 months, CPE is scheduled in May. Please advise.

## 2018-10-22 NOTE — Telephone Encounter (Signed)
Ok to refill his Altace for #30, 2 refills.  Also, it would be best for his wife to monitor his medications if he is not taking his medications as directed

## 2018-10-25 MED ORDER — RAMIPRIL 10 MG PO CAPS: 10.0000 mg | ORAL_CAPSULE | Freq: Every day | ORAL | 2 refills | Status: DC

## 2018-10-25 NOTE — Telephone Encounter (Signed)
Medication filled to pharmacy as requested.  Spoke with pt wife she said she would keep watch on his medications.

## 2018-10-25 NOTE — Addendum Note (Signed)
Addended by: Geannie Risen on: 10/25/2018 08:46 AM   Modules accepted: Orders

## 2019-01-07 ENCOUNTER — Encounter: Payer: Medicare HMO | Admitting: Family Medicine

## 2019-01-26 ENCOUNTER — Other Ambulatory Visit: Payer: Self-pay | Admitting: Family Medicine

## 2019-02-01 ENCOUNTER — Other Ambulatory Visit: Payer: Self-pay

## 2019-02-01 ENCOUNTER — Encounter: Payer: Self-pay | Admitting: Family Medicine

## 2019-02-01 ENCOUNTER — Ambulatory Visit (INDEPENDENT_AMBULATORY_CARE_PROVIDER_SITE_OTHER): Payer: Medicare HMO | Admitting: Family Medicine

## 2019-02-01 VITALS — BP 132/88 | HR 64 | Temp 98.0°F | Resp 17 | Ht 72.0 in | Wt 190.1 lb

## 2019-02-01 DIAGNOSIS — I10 Essential (primary) hypertension: Secondary | ICD-10-CM

## 2019-02-01 DIAGNOSIS — Z0000 Encounter for general adult medical examination without abnormal findings: Secondary | ICD-10-CM

## 2019-02-01 DIAGNOSIS — Z125 Encounter for screening for malignant neoplasm of prostate: Secondary | ICD-10-CM | POA: Diagnosis not present

## 2019-02-01 LAB — CBC WITH DIFFERENTIAL/PLATELET
Basophils Absolute: 0.1 10*3/uL (ref 0.0–0.1)
Basophils Relative: 0.9 % (ref 0.0–3.0)
Eosinophils Absolute: 0.3 10*3/uL (ref 0.0–0.7)
Eosinophils Relative: 4.4 % (ref 0.0–5.0)
HCT: 40.5 % (ref 39.0–52.0)
Hemoglobin: 13.6 g/dL (ref 13.0–17.0)
Lymphocytes Relative: 26 % (ref 12.0–46.0)
Lymphs Abs: 1.6 10*3/uL (ref 0.7–4.0)
MCHC: 33.5 g/dL (ref 30.0–36.0)
MCV: 89.4 fl (ref 78.0–100.0)
Monocytes Absolute: 0.4 10*3/uL (ref 0.1–1.0)
Monocytes Relative: 6.9 % (ref 3.0–12.0)
Neutro Abs: 3.7 10*3/uL (ref 1.4–7.7)
Neutrophils Relative %: 61.8 % (ref 43.0–77.0)
Platelets: 219 10*3/uL (ref 150.0–400.0)
RBC: 4.53 Mil/uL (ref 4.22–5.81)
RDW: 14.2 % (ref 11.5–15.5)
WBC: 6 10*3/uL (ref 4.0–10.5)

## 2019-02-01 LAB — TSH: TSH: 1.93 u[IU]/mL (ref 0.35–4.50)

## 2019-02-01 LAB — BASIC METABOLIC PANEL
BUN: 31 mg/dL — ABNORMAL HIGH (ref 6–23)
CO2: 26 mEq/L (ref 19–32)
Calcium: 9.1 mg/dL (ref 8.4–10.5)
Chloride: 103 mEq/L (ref 96–112)
Creatinine, Ser: 1.03 mg/dL (ref 0.40–1.50)
GFR: 68.88 mL/min (ref >60.00)
Glucose, Bld: 82 mg/dL (ref 70–99)
Potassium: 4.3 mEq/L (ref 3.5–5.1)
Sodium: 138 mEq/L (ref 135–145)

## 2019-02-01 LAB — HEPATIC FUNCTION PANEL
ALT: 14 U/L (ref 0–53)
AST: 17 U/L (ref 0–37)
Albumin: 4.3 g/dL (ref 3.5–5.2)
Alkaline Phosphatase: 61 U/L (ref 39–117)
Bilirubin, Direct: 0.1 mg/dL (ref 0.0–0.3)
Total Bilirubin: 0.4 mg/dL (ref 0.2–1.2)
Total Protein: 6.9 g/dL (ref 6.0–8.3)

## 2019-02-01 LAB — LIPID PANEL
Cholesterol: 243 mg/dL — ABNORMAL HIGH (ref 0–200)
HDL: 44.2 mg/dL (ref >39.00)
LDL Cholesterol: 165 mg/dL — ABNORMAL HIGH (ref 0–99)
NonHDL: 198.87
Total CHOL/HDL Ratio: 5
Triglycerides: 168 mg/dL — ABNORMAL HIGH (ref 0.0–149.0)
VLDL: 33.6 mg/dL (ref 0.0–40.0)

## 2019-02-01 LAB — PSA, MEDICARE: PSA: 5.87 ng/ml — ABNORMAL HIGH (ref 0.10–4.00)

## 2019-02-01 NOTE — Progress Notes (Signed)
   Subjective:    Patient ID: Jack Randolph, male    DOB: [birth year], 85 y.o.   MRN: [id number]  HPI CPE- UTD on immunizations.  No longer requiring colonoscopy.   Review of Systems Patient reports no vision changes, anorexia, fever ,adenopathy, persistant/recurrent hoarseness, swallowing issues, chest pain, palpitations, edema, persistant/recurrent cough, hemoptysis, dyspnea (rest,exertional, paroxysmal nocturnal), gastrointestinal  bleeding (melena, rectal bleeding), abdominal pain, excessive heart burn, GU symptoms (dysuria, hematuria, voiding/incontinence issues) syncope, focal weakness, memory loss, numbness & tingling, skin/hair/nail changes, depression, anxiety, abnormal bruising/bleeding, musculoskeletal symptoms/signs.   + hearing loss    Objective:   Physical Exam General Appearance:    Alert, cooperative, no distress, appears stated age  Head:    Normocephalic, without obvious abnormality, atraumatic  Eyes:    PERRL, conjunctiva/corneas clear, EOM's intact, fundi    benign, both eyes       Ears:    Normal TM's and external ear canals, both ears  Nose:   Nares normal, septum midline, mucosa normal, no drainage   or sinus tenderness  Throat:   Deferred due to COVID  Neck:   Supple, symmetrical, trachea midline, no adenopathy;       thyroid:  No enlargement/tenderness/nodules  Back:     Symmetric, no curvature, ROM normal, no CVA tenderness  Lungs:     Clear to auscultation bilaterally, respirations unlabored  Chest wall:    No tenderness or deformity  Heart:    Regular rate and rhythm, S1 and S2 normal, no murmur, rub   or gallop  Abdomen:     Soft, non-tender, bowel sounds active all four quadrants,    no masses, no organomegaly  Genitalia:    deferred  Rectal:    Extremities:   Extremities normal, atraumatic, no cyanosis or edema  Pulses:   2+ and symmetric all extremities  Skin:   Skin color, texture, turgor normal, no rashes or lesions  Lymph nodes:   Cervical,  supraclavicular, and axillary nodes normal  Neurologic:   CNII-XII intact. Normal strength, sensation and reflexes      throughout          Assessment & Plan:

## 2019-02-01 NOTE — Assessment & Plan Note (Signed)
Chronic problem.  Well controlled.  Currently asymptomatic.  Check labs.  No anticipated med changes.  Will follow. 

## 2019-02-01 NOTE — Patient Instructions (Signed)
Follow up in 1 year or as needed We'll notify you of your lab results and make any changes if needed Keep up the good work!  You look great! Please wear a mask when you go out to protect yourself Call with any questions or concerns Stay Safe!!!

## 2019-02-01 NOTE — Assessment & Plan Note (Signed)
Pt's PE WNL.  UTD on immunizations.  Check labs.  Anticipatory guidance provided.  

## 2019-02-02 ENCOUNTER — Other Ambulatory Visit: Payer: Self-pay | Admitting: Family Medicine

## 2019-02-04 ENCOUNTER — Telehealth: Payer: Self-pay | Admitting: Family Medicine

## 2019-02-04 ENCOUNTER — Encounter: Payer: Self-pay | Admitting: General Practice

## 2019-02-04 MED ORDER — RAMIPRIL 10 MG PO CAPS: 10.0000 mg | ORAL_CAPSULE | Freq: Every day | ORAL | 0 refills | Status: DC

## 2019-02-04 NOTE — Telephone Encounter (Signed)
Copied from [place] [phone]. Topic: Quick Communication - Rx Refill/Question >> Feb 04, 2019  8:12 AM Lionel December wrote: Medication: ramipril (ALTACE) 10 MG capsule  Has the patient contacted their pharmacy? Yes.   (Agent: If no, request that the patient contact the pharmacy for the refill.) (Agent: If yes, when and what did the pharmacy advise?)  Preferred Pharmacy (with phone number or street name): [place], [place] - [address] [phone] (Phone) [phone] (Fax)    Agent: Please be advised that RX refills may take up to 3 business days. We ask that you follow-up with your pharmacy.

## 2019-02-04 NOTE — Telephone Encounter (Signed)
Copied from CRM #[id number]. Topic: Quick Communication - Rx Refill/Question >> Feb 04, 2019  8:12 AM Beasley, Denise wrote: Medication: ramipril (ALTACE) 10 MG capsule  Has the patient contacted their pharmacy? Yes.   (Agent: If no, request that the patient contact the pharmacy for the refill.) (Agent: If yes, when and what did the pharmacy advise?)  Preferred Pharmacy (with phone number or street name): [facility] - [place], [place] - [address] [phone] (Phone) [phone] (Fax)    Agent: Please be advised that RX refills may take up to 3 business days. We ask that you follow-up with your pharmacy. 

## 2019-02-04 NOTE — Telephone Encounter (Signed)
Pt returned call and would like a CB regarding lab results at [phone]

## 2019-02-04 NOTE — Telephone Encounter (Signed)
Pt was spoken with in regards to labs.

## 2019-02-04 NOTE — Telephone Encounter (Deleted)
Copied from CRM #[id number]. Topic: Quick Communication - Rx Refill/Question >> Feb 04, 2019  8:12 AM Shermar Friedland wrote: Medication: ramipril (ALTACE) 10 MG capsule  Has the patient contacted their pharmacy? Yes.   (Agent: If no, request that the patient contact the pharmacy for the refill.) (Agent: If yes, when and what did the pharmacy advise?)  Preferred Pharmacy (with phone number or street name): [facility] - [place], [place] - [address] [phone] (Phone) [phone] (Fax)    Agent: Please be advised that RX refills may take up to 3 business days. We ask that you follow-up with your pharmacy. 

## 2019-02-04 NOTE — Progress Notes (Signed)
Called pt and lmovm to return call.    Ok for PEC to Discuss results / PCP recommendations / Schedule patient.  

## 2019-02-04 NOTE — Telephone Encounter (Signed)
Medication filled to pharmacy as requested.   

## 2019-02-06 ENCOUNTER — Encounter: Payer: Self-pay | Admitting: Family Medicine

## 2019-02-07 ENCOUNTER — Encounter: Payer: Self-pay | Admitting: Family Medicine

## 2019-02-10 ENCOUNTER — Encounter: Payer: Self-pay | Admitting: Family Medicine

## 2019-02-10 DIAGNOSIS — Z125 Encounter for screening for malignant neoplasm of prostate: Secondary | ICD-10-CM

## 2019-02-10 DIAGNOSIS — R972 Elevated prostate specific antigen [PSA]: Secondary | ICD-10-CM

## 2019-03-05 DIAGNOSIS — R3914 Feeling of incomplete bladder emptying: Secondary | ICD-10-CM | POA: Diagnosis not present

## 2019-03-05 DIAGNOSIS — N401 Enlarged prostate with lower urinary tract symptoms: Secondary | ICD-10-CM | POA: Diagnosis not present

## 2019-03-05 DIAGNOSIS — R972 Elevated prostate specific antigen [PSA]: Secondary | ICD-10-CM | POA: Diagnosis not present

## 2019-04-26 ENCOUNTER — Encounter: Payer: Medicare HMO | Admitting: Family Medicine

## 2019-05-27 DIAGNOSIS — H52223 Regular astigmatism, bilateral: Secondary | ICD-10-CM | POA: Diagnosis not present

## 2019-06-03 DIAGNOSIS — R3914 Feeling of incomplete bladder emptying: Secondary | ICD-10-CM | POA: Diagnosis not present

## 2019-06-03 DIAGNOSIS — R311 Benign essential microscopic hematuria: Secondary | ICD-10-CM | POA: Diagnosis not present

## 2019-06-03 DIAGNOSIS — N401 Enlarged prostate with lower urinary tract symptoms: Secondary | ICD-10-CM | POA: Diagnosis not present

## 2019-07-15 DIAGNOSIS — Z0100 Encounter for examination of eyes and vision without abnormal findings: Secondary | ICD-10-CM | POA: Diagnosis not present

## 2019-08-07 ENCOUNTER — Other Ambulatory Visit: Payer: Self-pay | Admitting: Family Medicine

## 2019-08-07 MED ORDER — RAMIPRIL 10 MG PO CAPS: 10.0000 mg | ORAL_CAPSULE | Freq: Every day | ORAL | 0 refills | Status: DC

## 2019-09-13 ENCOUNTER — Ambulatory Visit: Payer: Medicare HMO | Attending: Internal Medicine

## 2019-09-13 DIAGNOSIS — Z23 Encounter for immunization: Secondary | ICD-10-CM

## 2019-09-13 NOTE — Progress Notes (Signed)
   Covid-19 Vaccination Clinic  Name:  Anselm Aumiller    MRN: [id number] DOB: Jan 04, 1933  09/13/2019  Mr. Carfagno was observed post Covid-19 immunization for 15 minutes without incidence. He was provided with Vaccine Information Sheet and instruction to access the V-Safe system.   Mr. Shaddock was instructed to call 911 with any severe reactions post vaccine: Marland Kitchen Difficulty breathing  . Swelling of your face and throat  . A fast heartbeat  . A bad rash all over your body  . Dizziness and weakness    Immunizations Administered    Name Date Dose VIS Date Route   Pfizer COVID-19 Vaccine 09/13/2019  9:42 AM 0.3 mL 08/02/2019 Intramuscular   Manufacturer: [employer], [employer]   Lot: [id number]   NDC: [id number]

## 2019-10-04 ENCOUNTER — Ambulatory Visit: Payer: Medicare HMO | Attending: Internal Medicine

## 2019-10-04 DIAGNOSIS — Z23 Encounter for immunization: Secondary | ICD-10-CM

## 2019-10-04 NOTE — Progress Notes (Signed)
   Covid-19 Vaccination Clinic  Name:  Jack Randolph    MRN: [id number] DOB: 05-Jun-1933  10/04/2019  [name] was observed post Covid-19 immunization for 15 minutes without incidence. He was provided with Vaccine Information Sheet and instruction to access the V-Safe system.   [name] was instructed to call 911 with any severe reactions post vaccine: Marland Kitchen Difficulty breathing  . Swelling of your face and throat  . A fast heartbeat  . A bad rash all over your body  . Dizziness and weakness    Immunizations Administered    Name Date Dose VIS Date Route   Pfizer COVID-19 Vaccine 10/04/2019 10:35 AM 0.3 mL 08/02/2019 Intramuscular   Manufacturer: [employer], [employer]   Lot: [id number]   NDC: [address]-3

## 2020-03-14 ENCOUNTER — Observation Stay (HOSPITAL_COMMUNITY)
Admission: EM | Admit: 2020-03-14 | Discharge: 2020-03-15 | Disposition: A | Discharge status: Home / Self Care | Payer: Medicare HMO | Attending: Family Medicine | Admitting: Family Medicine

## 2020-03-14 ENCOUNTER — Emergency Department (HOSPITAL_COMMUNITY): Payer: Medicare HMO

## 2020-03-14 ENCOUNTER — Other Ambulatory Visit: Payer: Self-pay

## 2020-03-14 DIAGNOSIS — R531 Weakness: Secondary | ICD-10-CM | POA: Diagnosis not present

## 2020-03-14 DIAGNOSIS — Z79899 Other long term (current) drug therapy: Secondary | ICD-10-CM | POA: Diagnosis not present

## 2020-03-14 DIAGNOSIS — R200 Anesthesia of skin: Secondary | ICD-10-CM | POA: Diagnosis present

## 2020-03-14 DIAGNOSIS — R29818 Other symptoms and signs involving the nervous system: Secondary | ICD-10-CM | POA: Diagnosis not present

## 2020-03-14 DIAGNOSIS — Z20822 Contact with and (suspected) exposure to covid-19: Secondary | ICD-10-CM | POA: Diagnosis not present

## 2020-03-14 DIAGNOSIS — G939 Disorder of brain, unspecified: Secondary | ICD-10-CM | POA: Diagnosis not present

## 2020-03-14 DIAGNOSIS — R2990 Unspecified symptoms and signs involving the nervous system: Secondary | ICD-10-CM | POA: Diagnosis present

## 2020-03-14 DIAGNOSIS — I639 Cerebral infarction, unspecified: Secondary | ICD-10-CM | POA: Diagnosis not present

## 2020-03-14 DIAGNOSIS — M47812 Spondylosis without myelopathy or radiculopathy, cervical region: Secondary | ICD-10-CM | POA: Diagnosis not present

## 2020-03-14 DIAGNOSIS — S065X9A Traumatic subdural hemorrhage with loss of consciousness of unspecified duration, initial encounter: Secondary | ICD-10-CM | POA: Diagnosis present

## 2020-03-14 DIAGNOSIS — M4802 Spinal stenosis, cervical region: Secondary | ICD-10-CM | POA: Insufficient documentation

## 2020-03-14 DIAGNOSIS — I6200 Nontraumatic subdural hemorrhage, unspecified: Secondary | ICD-10-CM | POA: Diagnosis not present

## 2020-03-14 DIAGNOSIS — S065XAA Traumatic subdural hemorrhage with loss of consciousness status unknown, initial encounter: Secondary | ICD-10-CM | POA: Diagnosis present

## 2020-03-14 DIAGNOSIS — S065X0A Traumatic subdural hemorrhage without loss of consciousness, initial encounter: Secondary | ICD-10-CM | POA: Diagnosis not present

## 2020-03-14 DIAGNOSIS — I10 Essential (primary) hypertension: Secondary | ICD-10-CM | POA: Diagnosis not present

## 2020-03-14 DIAGNOSIS — G459 Transient cerebral ischemic attack, unspecified: Secondary | ICD-10-CM

## 2020-03-14 LAB — CBC
HCT: 43.6 % (ref 39.0–52.0)
Hemoglobin: 14.1 g/dL (ref 13.0–17.0)
MCH: 29.9 pg (ref 26.0–34.0)
MCHC: 32.3 g/dL (ref 30.0–36.0)
MCV: 92.6 fL (ref 80.0–100.0)
Platelets: 226 10*3/uL (ref 150–400)
RBC: 4.71 MIL/uL (ref 4.22–5.81)
RDW: 13.2 % (ref 11.5–15.5)
WBC: 5.7 10*3/uL (ref 4.0–10.5)
nRBC: 0 % (ref 0.0–0.2)

## 2020-03-14 LAB — URINALYSIS, ROUTINE W REFLEX MICROSCOPIC
Bilirubin Urine: NEGATIVE
Glucose, UA: NEGATIVE mg/dL
Hgb urine dipstick: NEGATIVE
Ketones, ur: NEGATIVE mg/dL
Leukocytes,Ua: NEGATIVE
Nitrite: NEGATIVE
Protein, ur: NEGATIVE mg/dL
Specific Gravity, Urine: 1.016 (ref 1.005–1.030)
pH: 5 (ref 5.0–8.0)

## 2020-03-14 LAB — BASIC METABOLIC PANEL
Anion gap: 9 (ref 5–15)
BUN: 25 mg/dL — ABNORMAL HIGH (ref 8–23)
CO2: 26 mmol/L (ref 22–32)
Calcium: 9.2 mg/dL (ref 8.9–10.3)
Chloride: 104 mmol/L (ref 98–111)
Creatinine, Ser: 1.03 mg/dL (ref 0.61–1.24)
GFR calc Af Amer: >60 mL/min (ref >60)
GFR calc non Af Amer: >60 mL/min (ref >60)
Glucose, Bld: 106 mg/dL — ABNORMAL HIGH (ref 70–99)
Potassium: 4 mmol/L (ref 3.5–5.1)
Sodium: 139 mmol/L (ref 135–145)

## 2020-03-14 LAB — SARS CORONAVIRUS 2 BY RT PCR (HOSPITAL ORDER, PERFORMED IN ~~LOC~~ HOSPITAL LAB): SARS Coronavirus 2: NEGATIVE

## 2020-03-14 LAB — CBG MONITORING, ED: Glucose-Capillary: 96 mg/dL (ref 70–99)

## 2020-03-14 NOTE — ED Notes (Signed)
Pt transported to MRI 

## 2020-03-14 NOTE — ED Triage Notes (Signed)
Pt here via EMS for eval of altered gait noticed by wife while out for a walk this morning. At the same time of altered gait, pt also endorsed bilateral arm numbness and weakness. By EMS arrival numbness had subsided. No focal neuro deficits in triage.

## 2020-03-14 NOTE — Consult Note (Signed)
Reason for Consult:possible subdural hematoma Referring Physician: Carrell, Jack Randolph is an 86 y.o. male.  HPI: whom was in his usual state of health this morning approximately 0600 when while taking a walk with his wife felt his arms go numb, and as he describes it "lost power". His gait became unsteady and he found it difficult to walk. His wife helped him to a park bench which did improve his situation. However she called for an ambulance and he was brought to the [facility] ED. A head CT was performed, though he denied any and all head trauma, and revealed extraaxial collections frontally and some hyperdensities in the region of the tentorium. I was called for neurosurgical evaluation.  Jack Randolph also states that approximately 3 weeks ago he had a transitory episode where his arms felt numb and weak for minutes. The weakness and numbness resolved. It was only yesterday that he felt something like this again and this morning.   Past Medical History:  Diagnosis Date  . Hypertension     No past surgical history on file.  Family History  Problem Relation Age of Onset  . Healthy Mother   . COPD Father     Social History:  reports that he has never smoked. He has never used smokeless tobacco. He reports current alcohol use. He reports that he does not use drugs.  Allergies: No Known Allergies  Medications: I have reviewed the patient's current medications.  Results for orders placed or performed during the hospital encounter of 03/14/20 (from the past 48 hour(s))  CBG monitoring, ED     Status: None   Collection Time: 03/14/20  8:01 AM  Result Value Ref Range   Glucose-Capillary 96 70 - 99 mg/dL    Comment: Glucose reference range applies only to samples taken after fasting for at least 8 hours.  Basic metabolic panel     Status: Abnormal   Collection Time: 03/14/20  8:20 AM  Result Value Ref Range   Sodium 139 135 - 145 mmol/L   Potassium 4.0 3.5 - 5.1 mmol/L    Chloride 104 98 - 111 mmol/L   CO2 26 22 - 32 mmol/L   Glucose, Bld 106 (H) 70 - 99 mg/dL    Comment: Glucose reference range applies only to samples taken after fasting for at least 8 hours.   BUN 25 (H) 8 - 23 mg/dL   Creatinine, Ser 4.09 0.61 - 1.24 mg/dL   Calcium 9.2 8.9 - 73.5 mg/dL   GFR calc non Af Amer >60 >60 mL/min   GFR calc Af Amer >60 >60 mL/min   Anion gap 9 5 - 15    Comment: Performed at [facility], [address], [place], [place] [address]  CBC     Status: None   Collection Time: 03/14/20  8:20 AM  Result Value Ref Range   WBC 5.7 4.0 - 10.5 K/uL   RBC 4.71 4.22 - 5.81 MIL/uL   Hemoglobin 14.1 13.0 - 17.0 g/dL   HCT 42.6 39 - 52 %   MCV 92.6 80.0 - 100.0 fL   MCH 29.9 26.0 - 34.0 pg   MCHC 32.3 30.0 - 36.0 g/dL   RDW 83.4 19.6 - 22.2 %   Platelets 226 150 - 400 K/uL   nRBC 0.0 0.0 - 0.2 %    Comment: Performed at [facility], [address] [place], [place] [address]  Urinalysis, Routine w reflex microscopic  Status: None   Collection Time: 03/14/20  5:47 PM  Result Value Ref Range   Color, Urine YELLOW YELLOW   APPearance CLEAR CLEAR   Specific Gravity, Urine 1.016 1.005 - 1.030   pH 5.0 5.0 - 8.0   Glucose, UA NEGATIVE NEGATIVE mg/dL   Hgb urine dipstick NEGATIVE NEGATIVE   Bilirubin Urine NEGATIVE NEGATIVE   Ketones, ur NEGATIVE NEGATIVE mg/dL   Protein, ur NEGATIVE NEGATIVE mg/dL   Nitrite NEGATIVE NEGATIVE   Leukocytes,Ua NEGATIVE NEGATIVE    Comment: Performed at [facility], [address] [place], [place] [address]    CT Head Wo Contrast  Addendum Date: 03/14/2020   ADDENDUM REPORT: 03/14/2020 17:04 ADDENDUM: Critical Value/emergent results were called by telephone at the time of interpretation on 03/14/2020 at 5:04 pm to provider [facility] , who verbally acknowledged these results. Electronically Signed   By: Kreg Shropshire M.D.   On: 03/14/2020 17:04   Result Date: 03/14/2020 CLINICAL DATA:  Neuro deficit, altered  gait EXAM: CT HEAD WITHOUT CONTRAST TECHNIQUE: Contiguous axial images were obtained from the base of the skull through the vertex without intravenous contrast. COMPARISON:  None. FINDINGS: Brain: There are layering crescentic extra-axial collections over the bilateral frontoparietal convexities measuring up to 8 mm on the left and 5 mm on the right. Additional mixed attenuation collection across the left cerebellar convexity as well measuring up to 6 mm in size. No acute intraparenchymal hemorrhage. No CT evidence of cortically based or large territory infarct is seen. No evidence of hydrocephaly. Basal cisterns are patent. No mass effect or midline shift. Findings on a background of diffuse parenchymal volume loss and chronic microvascular angiopathy. Vascular: Atherosclerotic calcification of the carotid siphons and intradural vertebral arteries. No hyperdense vessel. Skull: No calvarial fracture or suspicious osseous lesion. No scalp swelling or hematoma. Sinuses/Orbits: Paranasal sinuses and mastoid air cells are predominantly clear. Rightward nasal septal deviation with a contacting right-sided nasal septal spur. Included orbital structures are unremarkable. Other: None. IMPRESSION: 1. Small layering attenuation crescentic extra-axial collections over the bilateral frontoparietal convexities measuring up to 8 mm on the left and 5 mm on the right. Similar collection across the left cerebellar convexity up to 6 mm in thickness. 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 No significant resulting mass effect or midline shift. 2. Background of diffuse parenchymal volume loss and chronic microvascular angiopathy. 3. Intracranial atherosclerosis. 4. Rightward nasal septal deviation with a contacting right-sided nasal septal spur. Currently attempting to contact the ordering provider, addendum will be submitted upon case discussion. Electronically Signed: By: Kreg Shropshire M.D. On:  03/14/2020 17:00    Review of Systems  Constitutional: Negative.   HENT: Negative.   Eyes: Negative.   Respiratory: Negative.   Cardiovascular: Negative.   Gastrointestinal: Negative.   Endocrine: Negative.   Genitourinary: Negative.   Musculoskeletal: Negative.   Allergic/Immunologic: Positive for environmental allergies.       Reports new allergies relieved by claritin D since january  Neurological: Positive for weakness and numbness.  Hematological: Negative.   Psychiatric/Behavioral: Negative.    Blood pressure (!) 155/104, pulse 71, temperature 97.8 F (36.6 C), temperature source Oral, resp. rate 15, height 6' (1.829 m), weight 86.2 kg, SpO2 100 %. Physical Exam Constitutional:      Appearance: Normal appearance. He is normal weight.  HENT:     Head: Normocephalic.     Right Ear: Tympanic membrane normal.     Left Ear: Tympanic membrane  normal.     Nose: Nose normal.     Mouth/Throat:     Mouth: Mucous membranes are moist.     Pharynx: Oropharynx is clear.  Eyes:     Extraocular Movements: Extraocular movements intact.     Conjunctiva/sclera: Conjunctivae normal.     Pupils: Pupils are equal, round, and reactive to light.  Cardiovascular:     Rate and Rhythm: Normal rate and regular rhythm.     Pulses: Normal pulses.  Pulmonary:     Effort: Pulmonary effort is normal.  Abdominal:     General: Abdomen is flat.     Palpations: Abdomen is soft.  Musculoskeletal:        General: Normal range of motion.     Cervical back: Normal range of motion and neck supple.  Skin:    General: Skin is warm and dry.  Neurological:     General: No focal deficit present.     Mental Status: He is alert and oriented to person, place, and time.     Cranial Nerves: Cranial nerve deficit present.     Sensory: Sensation is intact. No sensory deficit.     Motor: No weakness.     Coordination: Coordination is intact. Coordination normal. Finger-Nose-Finger Test normal.     Deep Tendon  Reflexes: Reflexes normal.     Comments: Proprioception is intact in the upper and lower extremities Normal muscle tone and bulk Light touch, pinprick intact through out    Psychiatric:        Mood and Affect: Mood normal.        Behavior: Behavior normal.        Thought Content: Thought content normal.        Judgment: Judgment normal.     Assessment/Plan: CT findings noted. He is not an operative candidate at this time. I will see him for follow up in the office in 2 weeks. May need a neurology consult, possibility of a seizure. Unlikely though since the extraaxial collections are chronic in nature if they are indeed blood. Normal neurologically at this time.   Coletta Memos 03/14/2020, 6:58 PM

## 2020-03-14 NOTE — ED Notes (Signed)
Patient transported to CT 

## 2020-03-14 NOTE — ED Provider Notes (Signed)
[facility] EMERGENCY DEPARTMENT Provider Note   CSN: [id number] Arrival date & time: 03/14/20  1749     History No chief complaint on file.   Jack Randolph is a 86 y.o. male.  HPI   86 year old male with several complaints.  Today he was walking with his wife when he was having difficulty with his balance.  The symptoms have since resolved.  He relates that about 3 weeks ago he was standing when he had severe numbness in his bilateral arms.  This resolved over the period of few hours.  Yesterday he was working on his lawnmower when he had numbness in his entire right arm and weakness to the point that he had to physically lifted up with his left arm.  This again subsided over a few hours and completely resolved by the time he went to bed.  Denies any acute pain but has had some mild headaches and congestion which she is attributed to allergies.  No acute visual changes.  No confusion.  No changes in speech.  Despite being 47 he lives a very active lifestyle.  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  Past Medical History:  Diagnosis Date  . Hypertension     Patient Active Problem List   Diagnosis Date Noted  . Physical exam 06/14/2017  . HTN (hypertension) 12/19/2016  . Overweight (BMI 25.0-29.9) 12/19/2016  . Colon cancer screening 12/19/2016    No past surgical history on file.     Family History  Problem Relation Age of Onset  . Healthy Mother   . COPD Father     Social History   Tobacco Use  . Smoking status: Never Smoker  . Smokeless tobacco: Never Used  Vaping Use  . Vaping Use: Never used  Substance Use Topics  . Alcohol use: Yes  . Drug use: No    Home Medications Prior to Admission medications   Medication Sig Start Date End Date Taking? Authorizing Provider  Coenzyme Q10 (CO Q 10) 100 MG CAPS Take by mouth.    [provider]  Providence Lanius (OMEGA-3) 500 MG CAPS Take by mouth.    [provider]  Multiple  Vitamins-Minerals (MULTIVITAMIN ADULTS PO) Take by mouth.    [provider]  ramipril (ALTACE) 10 MG capsule Take 1 capsule (10 mg total) by mouth daily. 08/07/19   Sheliah Hatch, MD    Allergies    Patient has no known allergies.  Review of Systems   Review of Systems All systems reviewed and negative, other than as noted in HPI.  Physical Exam Updated Vital Signs BP (!) 155/104   Pulse 71   Temp 97.8 F (36.6 C) (Oral)   Resp 15   Ht 6' (1.829 m)   Wt 86.2 kg   SpO2 100%   BMI 25.77 kg/m   Physical Exam Vitals and nursing note reviewed.  Constitutional:      General: He is not in acute distress.    Appearance: He is well-developed.  HENT:     Head: Normocephalic and atraumatic.  Eyes:     General:        Right eye: No discharge.        Left eye: No discharge.     Conjunctiva/sclera: Conjunctivae normal.  Cardiovascular:     Rate and Rhythm: Normal rate and regular rhythm.     Heart sounds: Normal heart sounds. No murmur heard.  No friction rub. No gallop.  Pulmonary:     Effort: Pulmonary effort is normal. No respiratory distress.     Breath sounds: Normal breath sounds.  Abdominal:     General: There is no distension.     Palpations: Abdomen is soft.     Tenderness: There is no abdominal tenderness.  Musculoskeletal:        General: No tenderness.     Cervical back: Neck supple.  Skin:    General: Skin is warm and dry.  Neurological:     General: No focal deficit present.     Mental Status: He is alert and oriented to person, place, and time. Mental status is at baseline.     Cranial Nerves: No cranial nerve deficit.     Sensory: Sensory deficit present.     Motor: No weakness.     Coordination: Coordination abnormal.  Psychiatric:        Behavior: Behavior normal.        Thought Content: Thought content normal.     ED Results / Procedures / Treatments   Labs (all labs ordered are listed, but only abnormal results are  displayed) Labs Reviewed  BASIC METABOLIC PANEL - Abnormal; Notable for the following components:      Result Value   Glucose, Bld 106 (*)    BUN 25 (*)    All other components within normal limits  HEMOGLOBIN A1C - Abnormal; Notable for the following components:   Hgb A1c MFr Bld 5.7 (*)    All other components within normal limits  LIPID PANEL - Abnormal; Notable for the following components:   Cholesterol 258 (*)    LDL Cholesterol 197 (*)    All other components within normal limits  SARS CORONAVIRUS 2 BY RT PCR (HOSPITAL ORDER, PERFORMED IN [facility])  CBC  URINALYSIS, ROUTINE W REFLEX MICROSCOPIC  CBG MONITORING, ED  CBG MONITORING, ED    EKG EKG Interpretation  Date/Time:  Saturday March 14 2020 08:04:40 EDT Ventricular Rate:  75 PR Interval:  216 QRS Duration: 98 QT Interval:  386 QTC Calculation: 431 R Axis:   27 Text Interpretation: Sinus rhythm with 1st degree A-V block Otherwise normal ECG Confirmed by Raeford Razor [phone]) on 03/14/2020 4:45:32 PM   Radiology CT Head Wo Contrast  Addendum Date: 03/14/2020   ADDENDUM REPORT: 03/14/2020 17:04 ADDENDUM: Critical Value/emergent results were called by telephone at the time of interpretation on 03/14/2020 at 5:04 pm to provider [facility] , who verbally acknowledged these results. Electronically Signed   By: Kreg Shropshire M.D.   On: 03/14/2020 17:04   Result Date: 03/14/2020 CLINICAL DATA:  Neuro deficit, altered gait EXAM: CT HEAD WITHOUT CONTRAST TECHNIQUE: Contiguous axial images were obtained from the base of the skull through the vertex without intravenous contrast. COMPARISON:  None. FINDINGS: Brain: There are layering crescentic extra-axial collections over the bilateral frontoparietal convexities measuring up to 8 mm on the left and 5 mm on the right. Additional mixed attenuation collection across the left cerebellar convexity as well measuring up to 6 mm in size. No acute intraparenchymal  hemorrhage. No CT evidence of cortically based or large territory infarct is seen. No evidence of hydrocephaly. Basal cisterns are patent. No mass effect or midline shift. Findings on a background of diffuse parenchymal volume loss and chronic microvascular angiopathy. Vascular: Atherosclerotic calcification of the carotid siphons and intradural vertebral arteries. No hyperdense vessel. Skull: No calvarial fracture or suspicious osseous lesion. No scalp swelling or hematoma. Sinuses/Orbits: Paranasal sinuses  and mastoid air cells are predominantly clear. Rightward nasal septal deviation with a contacting right-sided nasal septal spur. Included orbital structures are unremarkable. Other: None. IMPRESSION: 1. Small layering attenuation crescentic extra-axial collections over the bilateral frontoparietal convexities measuring up to 8 mm on the left and 5 mm on the right. Similar collection across the left cerebellar convexity up to 6 mm in thickness. 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 No significant resulting mass effect or midline shift. 2. Background of diffuse parenchymal volume loss and chronic microvascular angiopathy. 3. Intracranial atherosclerosis. 4. Rightward nasal septal deviation with a contacting right-sided nasal septal spur. Currently attempting to contact the ordering provider, addendum will be submitted upon case discussion. Electronically Signed: By: Kreg Shropshire M.D. On: 03/14/2020 17:00   MR BRAIN WO CONTRAST  Result Date: 03/14/2020 CLINICAL DATA:  TIA. Episode of sudden numbness in both upper extremities difficulty walking. Symptoms improved after sitting down. Abnormal CT scan bilateral extra-axial collections representing chronic subdural hematomas or hygromas. EXAM: MRI HEAD WITHOUT CONTRAST TECHNIQUE: Multiplanar, multiecho pulse sequences of the brain and surrounding structures were obtained without intravenous contrast. COMPARISON:  None. FINDINGS:  Brain: Bilateral extra-axial collections demonstrate intermediate T1 signal and increased T2 signal. Fluid layers along the interhemispheric fissure is well. These likely represent subacute hemorrhage. Collections measures 7.5 mm each on coronal images. No mass effect is present. No acute infarct or parenchymal hemorrhage is present. No mass lesion is present. Mild periventricular and scattered subcortical T2 hyperintensities are present bilaterally, likely within normal limits for age. The ventricles are of normal size. The internal auditory canals are within normal limits. The brainstem and cerebellum are within normal limits. Vascular: Flow is present in the major intracranial arteries. Skull and upper cervical spine: The craniocervical junction is normal. Upper cervical spine is within normal limits. Marrow signal is unremarkable. Sinuses/Orbits: The paranasal sinuses and mastoid air cells are clear. The globes and orbits are within normal limits. IMPRESSION: 1. Bilateral subacute subdural hematomas measuring 7.5 mm each. No significant mass effect is present. 2. Brain parenchyma is normal for age. Electronically Signed   By: Marin Roberts M.D.   On: 03/14/2020 23:01   MR CERVICAL SPINE WO CONTRAST  Result Date: 03/14/2020 CLINICAL DATA:  ) Bilateral upper extremity numbness difficulty walking. Symptoms improved after sitting down. Abnormal head CT with bilateral subdural hematomas. EXAM: MRI CERVICAL SPINE WITHOUT CONTRAST TECHNIQUE: Multiplanar, multisequence MR imaging of the cervical spine was performed. No intravenous contrast was administered. COMPARISON:  None. FINDINGS: Alignment: Slight retrolisthesis is present at C5-6 and C6-7. No other significant listhesis is present. Normal lordosis is straightened. Vertebrae: Chronic endplate marrow changes are present at C5-6 and C6-7 Cord: Minimal T2 cord signal is present at C4-5 and C5-6 on the STIR sequence. The cord is narrowed at these levels.  Cord morphology and signal is otherwise normal. Posterior Fossa, vertebral arteries, paraspinal tissues: Insert normal craniocervical Disc levels: C2-3: Advanced facet hypertrophy is present bilaterally. Uncovertebral spurring contributes to mild left foraminal narrowing. C3-4: Asymmetric right-sided uncovertebral facet hypertrophy result in severe right and mild left foraminal narrowing. C4-5: Broad-based disc osteophyte complex partially effaces the ventral CSF. Facet hypertrophy contributes to mild foraminal narrowing bilaterally. C5-6: A broad-based disc osteophyte complex is present. The canal is narrowed to 6.5 mm. Uncovertebral spurring results in severe left and moderate right foraminal narrowing. C6-7: A broad-based disc osteophyte complex is present. The canal is narrowed to 9 mm. Moderate foraminal stenosis is present bilaterally. C7-T1:  Facet hypertrophy is present bilaterally. No significant disc protrusion or stenosis is present. IMPRESSION: 1. 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 2. Mild central canal narrowing at C6-7. 3. Severe right and mild left foraminal narrowing at C3-4. 4. Mild foraminal narrowing bilaterally at C4-5. 5. Severe left and moderate right foraminal narrowing at C5-6. 6. Moderate foraminal narrowing bilaterally at C6-7. 7. Moderate degenerative changes at C5-6 and C6-7. 8. No acute trauma. Electronically Signed   By: Marin Robertshristopher  Mattern M.D.   On: 03/14/2020 23:06    Procedures Procedures (including critical care time)  Medications Ordered in ED Medications - No data to display  ED Course  I have reviewed the triage vital signs and the nursing notes.  Pertinent labs & imaging results that were available during my care of the patient were reviewed by me and considered in my medical decision making (see chart for details).    MDM Rules/Calculators/A&P                          86 year old male with  intermittent arm numbness/tingling and some difficulty with balance earlier today.  Symptoms now resolved.  Neuro exam is nonfocal.  CT the head showing bilateral subdurals, likely chronic but possible subacute component. Doesn't remember any significant trauma recently. Not anticoagulated. Neurosurgery consulted but do not feel that clinically this explains his symptoms.  Neurology subsequently consulted.  MRI brain unremarkable aside from aforementioned subdurals.  He does have some pretty significant stenosis in the cervical spine including some mild cord edema at C5/6. Probably explains symptoms. Will admit for full TIA w/u though.   Final Clinical Impression(s) / ED Diagnoses Final diagnoses:  TIA (transient ischemic attack)    Rx / DC Orders ED Discharge Orders    None       Raeford RazorKohut, [place], MD 03/15/20 [phone]

## 2020-03-15 ENCOUNTER — Observation Stay (HOSPITAL_COMMUNITY): Payer: Medicare HMO

## 2020-03-15 ENCOUNTER — Encounter (HOSPITAL_COMMUNITY): Payer: Self-pay | Admitting: Internal Medicine

## 2020-03-15 ENCOUNTER — Encounter (HOSPITAL_COMMUNITY): Payer: Medicare HMO

## 2020-03-15 DIAGNOSIS — I10 Essential (primary) hypertension: Secondary | ICD-10-CM | POA: Diagnosis not present

## 2020-03-15 DIAGNOSIS — S065X9A Traumatic subdural hemorrhage with loss of consciousness of unspecified duration, initial encounter: Secondary | ICD-10-CM | POA: Diagnosis not present

## 2020-03-15 DIAGNOSIS — G459 Transient cerebral ischemic attack, unspecified: Secondary | ICD-10-CM

## 2020-03-15 DIAGNOSIS — R531 Weakness: Secondary | ICD-10-CM | POA: Diagnosis not present

## 2020-03-15 DIAGNOSIS — R569 Unspecified convulsions: Secondary | ICD-10-CM | POA: Diagnosis not present

## 2020-03-15 DIAGNOSIS — I6503 Occlusion and stenosis of bilateral vertebral arteries: Secondary | ICD-10-CM | POA: Diagnosis not present

## 2020-03-15 DIAGNOSIS — R2990 Unspecified symptoms and signs involving the nervous system: Secondary | ICD-10-CM | POA: Diagnosis present

## 2020-03-15 DIAGNOSIS — S065XAA Traumatic subdural hemorrhage with loss of consciousness status unknown, initial encounter: Secondary | ICD-10-CM | POA: Diagnosis present

## 2020-03-15 DIAGNOSIS — R200 Anesthesia of skin: Secondary | ICD-10-CM | POA: Diagnosis not present

## 2020-03-15 DIAGNOSIS — G939 Disorder of brain, unspecified: Secondary | ICD-10-CM | POA: Diagnosis not present

## 2020-03-15 LAB — LIPID PANEL
Cholesterol: 258 mg/dL — ABNORMAL HIGH (ref 0–200)
HDL: 48 mg/dL (ref >40)
LDL Cholesterol: 197 mg/dL — ABNORMAL HIGH (ref 0–99)
Total CHOL/HDL Ratio: 5.4 RATIO
Triglycerides: 64 mg/dL (ref <150)
VLDL: 13 mg/dL (ref 0–40)

## 2020-03-15 LAB — HEMOGLOBIN A1C
Hgb A1c MFr Bld: 5.7 % — ABNORMAL HIGH (ref 4.8–5.6)
Mean Plasma Glucose: 116.89 mg/dL

## 2020-03-15 MED ORDER — HYDRALAZINE HCL 25 MG PO TABS: 25.0000 mg | ORAL_TABLET | Freq: Four times a day (QID) | ORAL | Status: DC | PRN

## 2020-03-15 MED ORDER — STROKE: EARLY STAGES OF RECOVERY BOOK: Freq: Once | Status: DC

## 2020-03-15 MED ORDER — IOHEXOL 350 MG/ML SOLN
75.0000 mL | Freq: Once | INTRAVENOUS | Status: AC | PRN
  Administered 2020-03-15: 75 mL via INTRAVENOUS

## 2020-03-15 MED ORDER — ACETAMINOPHEN 160 MG/5ML PO SOLN: 650.0000 mg | ORAL | Status: DC | PRN

## 2020-03-15 MED ORDER — TAMSULOSIN HCL 0.4 MG PO CAPS
0.4000 mg | ORAL_CAPSULE | Freq: Two times a day (BID) | ORAL | Status: DC
  Administered 2020-03-15: 0.4 mg via ORAL
  Filled 2020-03-15: qty 1

## 2020-03-15 MED ORDER — ACETAMINOPHEN 650 MG RE SUPP: 650.0000 mg | RECTAL | Status: DC | PRN

## 2020-03-15 MED ORDER — ACETAMINOPHEN 325 MG PO TABS: 650.0000 mg | ORAL_TABLET | ORAL | Status: DC | PRN

## 2020-03-15 MED ORDER — RAMIPRIL 10 MG PO CAPS
10.0000 mg | ORAL_CAPSULE | Freq: Every day | ORAL | Status: DC
  Administered 2020-03-15: 10 mg via ORAL
  Filled 2020-03-15: qty 1

## 2020-03-15 MED ORDER — AMLODIPINE BESYLATE 5 MG PO TABS
5.0000 mg | ORAL_TABLET | Freq: Every day | ORAL | 5 refills | Status: DC
Start: 2020-03-15 — End: 2020-03-20

## 2020-03-15 NOTE — Evaluation (Signed)
Occupational Therapy Evaluation Patient Details Name: Jack Randolph MRN: [id number] DOB: 08-Jul-1933 Today's Date: 03/15/2020    History of Present Illness Pt is an 86 y/o male admitted secondary to numbness in BUEs and difficulty walking. 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 PMH includes HTN.    Clinical Impression   Patient evaluated by Occupational Therapy with no further acute OT needs identified. All education has been completed and the patient has no further questions. Pt appears to be at baseline - no apparent deficits noted. See below for any follow-up Occupational Therapy or equipment needs. OT is signing off. Thank you for this referral.      Follow Up Recommendations  No OT follow up    Equipment Recommendations  None recommended by OT    Recommendations for Other Services       Precautions / Restrictions Precautions Precautions: None      Mobility Bed Mobility Overal bed mobility: Independent                Transfers Overall transfer level: Independent                    Balance Overall balance assessment: Needs assistance Sitting-balance support: No upper extremity supported;Feet supported Sitting balance-Leahy Scale: Good     Standing balance support: No upper extremity supported;During functional activity Standing balance-Leahy Scale: Good                             ADL either performed or assessed with clinical judgement   ADL Overall ADL's : Independent                                             Vision Baseline Vision/History: Wears glasses Wears Glasses: At all times Patient Visual Report: No change from baseline Vision Assessment?: Yes Eye Alignment: Within Functional Limits Ocular Range of Motion: Within Functional Limits Alignment/Gaze Preference: Within  Defined Limits Tracking/Visual Pursuits: Able to track stimulus in all quads without difficulty Convergence: Within functional limits Visual Fields: No apparent deficits     Development worker, international aid Tested?: Yes   Praxis Praxis Praxis tested?: Within functional limits    Pertinent Vitals/Pain Pain Assessment: No/denies pain     Hand Dominance Right   Extremity/Trunk Assessment Upper Extremity Assessment Upper Extremity Assessment: Overall WFL for tasks assessed   Lower Extremity Assessment Lower Extremity Assessment: Overall WFL for tasks assessed   Cervical / Trunk Assessment Cervical / Trunk Assessment: Normal   Communication Communication Communication: HOH   Cognition Arousal/Alertness: Awake/alert Behavior During Therapy: WFL for tasks assessed/performed Overall Cognitive Status: Within Functional Limits for tasks assessed                                 General Comments: Pt able to recall details of the day accurately.  He scored 2/28 on the Short Blessed Test.  He was able to perform serial 2's while ambulating.  He did struggle with serial 7s subtraction, but was able to perform without error    General Comments       Exercises     Shoulder Instructions      Home Living  Family/patient expects to be discharged to:: Private residence Living Arrangements: Spouse/significant other Available Help at Discharge: Family;Available 24 hours/day Type of Home: House Home Access: Stairs to enter [employer] of Steps: 2 Entrance Stairs-Rails: Right;Left;Can reach both Home Layout: Two level Alternate Level Stairs-Number of Steps: flight Alternate Level Stairs-Rails: Right;Left Bathroom Shower/Tub: Walk-in shower;Tub/shower unit   Bathroom Toilet: Handicapped height     Home Equipment: None          Prior Functioning/Environment Level of Independence: Independent        Comments: Pt has a BS in Patent attorney from  [employer].  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         OT Problem List: Impaired balance (sitting and/or standing)      OT Treatment/Interventions:      OT Goals(Current goals can be found in the care plan section) Acute Rehab OT Goals Patient Stated Goal: to go home OT Goal Formulation: All assessment and education complete, DC therapy  OT Frequency:     Barriers to D/C:            Co-evaluation              AM-PAC OT "6 Clicks" Daily Activity     Outcome Measure Help from another person eating meals?: None Help from another person taking care of personal grooming?: None Help from another person toileting, which includes using toliet, bedpan, or urinal?: None Help from another person bathing (including washing, rinsing, drying)?: None Help from another person to put on and taking off regular upper body clothing?: None Help from another person to put on and taking off regular lower body clothing?: None 6 Click Score: 24   End of Session    Activity Tolerance: Patient tolerated treatment well Patient left: in bed;with call bell/phone within reach  OT Visit Diagnosis: Unsteadiness on feet (R26.81)                Time: 6301-6010 OT Time Calculation (min): 29 min Charges:  OT General Charges $OT Visit: 1 Visit OT Evaluation $OT Eval Low Complexity: 1 Low OT Treatments $Therapeutic Activity: 8-22 mins  Eber Jones., OTR/L Acute Rehabilitation Services Pager [phone] Office [phone]   Jeani Hawking M 03/15/2020, 5:22 PM

## 2020-03-15 NOTE — Progress Notes (Signed)
Patient ID: Levester Waldridge, male   DOB: 1932-12-14, 86 y.o.   MRN: [id number] BP (!) 148/99   Pulse 66   Temp 97.8 F (36.6 C) (Oral)   Resp 14   Ht 6' (1.829 m)   Wt 86.2 kg   SpO2 99%   BMI 25.77 kg/m  Exam shows normal strength. No myelopathy on exam. Normal coordination. Negative romberg.  At this time the mri simply shows he has canal stenosis of long standing duration.  This does not cause waxing and waning symptoms. There has been no acute change in his anatomy to coincide with his symptoms, which are completely resolved at this time.  There is no operative indication for the extra axial collections, there is no pressure on the brain.  As previously stated I will see him in follow up.

## 2020-03-15 NOTE — Procedures (Signed)
Patient Name: Jack Randolph  MRN: [id number]  Epilepsy Attending: Charlsie Quest  Referring Physician/Provider: Dr Ritta Slot Date: 03/15/2020  Duration: 25.07 mins  Patient history:  86 y.o. male with a history of hypertension who presents with multiple episodes concerning for transient neurological events.  The first was about 3 weeks ago, when he had sudden loss of function of both arms.  This gradually improved over the period of a few hours.  He states that he had both numbness and weakness of his arms at that time.  Yesterday he was working on his lawn more when he had numbness of his entire right arm and weakness which again gradually improved over a few hours.  Today, he went for a walk and while he was walking he noticed that he began having trouble with his balance.  This was persistently increasing and difficulty to the point where he was having difficulty walking.  This again gradually improved over a few hours. EEG to evaluate for seizure.  Level of alertness: Awake, asleep  AEDs during EEG study: None  Technical aspects: This EEG study was done with scalp electrodes positioned according to the 10-20 International system of electrode placement. Electrical activity was acquired at a sampling rate of 500Hz  and reviewed with a high frequency filter of 70Hz  and a low frequency filter of 1Hz . EEG data were recorded continuously and digitally stored.   Description: The posterior dominant rhythm consists of 9 Hz activity of moderate voltage (25-35 uV) seen predominantly in posterior head regions, symmetric and reactive to eye opening and eye closing. Sleep was characterized by vertex waves, sleep spindles (12 to 14 Hz), maximal frontocentral region.  Hyperventilation and photic stimulation were not performed.  IMPRESSION: This study is within normal limits. No seizures or epileptiform discharges were seen throughout the recording.  Jack Randolph 

## 2020-03-15 NOTE — Procedures (Signed)
Echo attempted. Patient just started eating lunch. Will attempt again later.

## 2020-03-15 NOTE — ED Notes (Signed)
Patient verbalizes understanding of discharge instructions. Opportunity for questioning and answers were provided. Armband removed by staff, pt discharged from ED.  

## 2020-03-15 NOTE — Progress Notes (Signed)
EEG complete - results pending 

## 2020-03-15 NOTE — H&P (Signed)
History and Physical    Jack Randolph:[id number] DOB: [birth year] DOA: 03/14/2020  PCP: Jack Hatch, MD  Patient coming from: Home  I have personally briefly reviewed patient's old medical records in [facility]  Chief Complaint: Weakness  HPI: Jack Randolph is a 86 y.o. male with medical history significant of HTN.  Very active for age, walks without walker at baseline.  Today he was walking with his wife when he was having difficulty with his balance.  The symptoms have since resolved.  He relates that about 3 weeks ago he was standing when he had severe numbness in his bilateral arms.  This resolved over the period of few hours.  Yesterday he was working on his lawnmower when he had numbness in his entire right arm and weakness to the point that he had to physically lifted up with his left arm.  This again subsided over a few hours and completely resolved by the time he went to bed.  No h/o head or neck trauma, no falls.   ED Course: CT head shows B chronic SDHs, Dr. Franky Macho consulted, see his note.  Later during the ED course, an MRI brain and C spine is performed.  MRI C spine showing severe canal stenosis and questionable mild cord edema.  Patient is completely asymptomatic at this time however.  Neurology consulted, pt being admitted for TIA work up.   Review of Systems: As per HPI, otherwise all review of systems negative.  Past Medical History:  Diagnosis Date  . Hypertension     No past surgical history on file.   reports that he has never smoked. He has never used smokeless tobacco. He reports current alcohol use. He reports that he does not use drugs.  No Known Allergies  Family History  Problem Relation Age of Onset  . Healthy Mother   . COPD Father      Prior to Admission medications   Medication Sig Start Date End Date Taking? Authorizing Provider  ramipril (ALTACE) 10 MG capsule Take 1 capsule (10 mg total) by mouth  daily. 08/07/19  Yes Jack Hatch, MD  tamsulosin (FLOMAX) 0.4 MG CAPS capsule Take 0.4 mg by mouth every 12 (twelve) hours. 02/03/20  Yes [provider]    Physical Exam: Vitals:   03/14/20 1551 03/14/20 1630 03/14/20 1748 03/14/20 1749  BP: (!) 148/96 (!) 137/94 (!) 155/105 (!) 155/104  Pulse: 75 67 72 71  Resp: 21 17 (!) 24 15  Temp:      TempSrc:      SpO2: 100% 99% 99% 100%  Weight:      Height:        Constitutional: NAD, calm, comfortable Eyes: PERRL, lids and conjunctivae normal ENMT: Mucous membranes are moist. Posterior pharynx clear of any exudate or lesions.Normal dentition.  Neck: normal, supple, no masses, no thyromegaly Respiratory: clear to auscultation bilaterally, no wheezing, no crackles. Normal respiratory effort. No accessory muscle use.  Cardiovascular: Regular rate and rhythm, no murmurs / rubs / gallops. No extremity edema. 2+ pedal pulses. No carotid bruits.  Abdomen: no tenderness, no masses palpated. No hepatosplenomegaly. Bowel sounds positive.  Musculoskeletal: no clubbing / cyanosis. No joint deformity upper and lower extremities. Good ROM, no contractures. Normal muscle tone.  Skin: no rashes, lesions, ulcers. No induration Neurologic: CN 2-12 grossly intact. Sensation intact, DTR normal. Strength 5/5 in all 4.  Psychiatric: Normal judgment and insight. Alert and oriented x 3. Normal mood.  Labs on Admission: I have personally reviewed following labs and imaging studies  CBC: Recent Labs  Lab 03/14/20 0820  WBC 5.7  HGB 14.1  HCT 43.6  MCV 92.6  PLT 226   Basic Metabolic Panel: Recent Labs  Lab 03/14/20 0820  NA 139  K 4.0  CL 104  CO2 26  GLUCOSE 106*  BUN 25*  CREATININE 1.03  CALCIUM 9.2   GFR: Estimated Creatinine Clearance: 58.6 mL/min (by C-G formula based on SCr of 1.03 mg/dL). Liver Function Tests: No results for input(s): AST, ALT, ALKPHOS, BILITOT, PROT, ALBUMIN in the last 168 hours. No results for  input(s): LIPASE, AMYLASE in the last 168 hours. No results for input(s): AMMONIA in the last 168 hours. Coagulation Profile: No results for input(s): INR, PROTIME in the last 168 hours. Cardiac Enzymes: No results for input(s): CKTOTAL, CKMB, CKMBINDEX, TROPONINI in the last 168 hours. BNP (last 3 results) No results for input(s): PROBNP in the last 8760 hours. HbA1C: No results for input(s): HGBA1C in the last 72 hours. CBG: Recent Labs  Lab 03/14/20 0801  GLUCAP 96   Lipid Profile: No results for input(s): CHOL, HDL, LDLCALC, TRIG, CHOLHDL, LDLDIRECT in the last 72 hours. Thyroid Function Tests: No results for input(s): TSH, T4TOTAL, FREET4, T3FREE, THYROIDAB in the last 72 hours. Anemia Panel: No results for input(s): VITAMINB12, FOLATE, FERRITIN, TIBC, IRON, RETICCTPCT in the last 72 hours. Urine analysis:    Component Value Date/Time   COLORURINE YELLOW 03/14/2020 1747   APPEARANCEUR CLEAR 03/14/2020 1747   LABSPEC 1.016 03/14/2020 1747   PHURINE 5.0 03/14/2020 1747   GLUCOSEU NEGATIVE 03/14/2020 1747   HGBUR NEGATIVE 03/14/2020 1747   BILIRUBINUR NEGATIVE 03/14/2020 1747   KETONESUR NEGATIVE 03/14/2020 1747   PROTEINUR NEGATIVE 03/14/2020 1747   NITRITE NEGATIVE 03/14/2020 1747   LEUKOCYTESUR NEGATIVE 03/14/2020 1747    Radiological Exams on Admission: CT Head Wo Contrast  Addendum Date: 03/14/2020   ADDENDUM REPORT: 03/14/2020 17:04 ADDENDUM: Critical Value/emergent results were called by telephone at the time of interpretation on 03/14/2020 at 5:04 pm to provider [facility] , who verbally acknowledged these results. Electronically Signed   By: Kreg Shropshire M.D.   On: 03/14/2020 17:04   Result Date: 03/14/2020 CLINICAL DATA:  Neuro deficit, altered gait EXAM: CT HEAD WITHOUT CONTRAST TECHNIQUE: Contiguous axial images were obtained from the base of the skull through the vertex without intravenous contrast. COMPARISON:  None. FINDINGS: Brain: There are layering  crescentic extra-axial collections over the bilateral frontoparietal convexities measuring up to 8 mm on the left and 5 mm on the right. Additional mixed attenuation collection across the left cerebellar convexity as well measuring up to 6 mm in size. No acute intraparenchymal hemorrhage. No CT evidence of cortically based or large territory infarct is seen. No evidence of hydrocephaly. Basal cisterns are patent. No mass effect or midline shift. Findings on a background of diffuse parenchymal volume loss and chronic microvascular angiopathy. Vascular: Atherosclerotic calcification of the carotid siphons and intradural vertebral arteries. No hyperdense vessel. Skull: No calvarial fracture or suspicious osseous lesion. No scalp swelling or hematoma. Sinuses/Orbits: Paranasal sinuses and mastoid air cells are predominantly clear. Rightward nasal septal deviation with a contacting right-sided nasal septal spur. Included orbital structures are unremarkable. Other: None. IMPRESSION: 1. Small layering attenuation crescentic extra-axial collections over the bilateral frontoparietal convexities measuring up to 8 mm on the left and 5 mm on the right. Similar collection across the left cerebellar convexity up to 6 mm  in thickness. 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 No significant resulting mass effect or midline shift. 2. Background of diffuse parenchymal volume loss and chronic microvascular angiopathy. 3. Intracranial atherosclerosis. 4. Rightward nasal septal deviation with a contacting right-sided nasal septal spur. Currently attempting to contact the ordering provider, addendum will be submitted upon case discussion. Electronically Signed: By: Kreg ShropshirePrice  DeHay M.D. On: 03/14/2020 17:00   MR BRAIN WO CONTRAST  Result Date: 03/14/2020 CLINICAL DATA:  TIA. Episode of sudden numbness in both upper extremities difficulty walking. Symptoms improved after sitting down. Abnormal CT scan  bilateral extra-axial collections representing chronic subdural hematomas or hygromas. EXAM: MRI HEAD WITHOUT CONTRAST TECHNIQUE: Multiplanar, multiecho pulse sequences of the brain and surrounding structures were obtained without intravenous contrast. COMPARISON:  None. FINDINGS: Brain: Bilateral extra-axial collections demonstrate intermediate T1 signal and increased T2 signal. Fluid layers along the interhemispheric fissure is well. These likely represent subacute hemorrhage. Collections measures 7.5 mm each on coronal images. No mass effect is present. No acute infarct or parenchymal hemorrhage is present. No mass lesion is present. Mild periventricular and scattered subcortical T2 hyperintensities are present bilaterally, likely within normal limits for age. The ventricles are of normal size. The internal auditory canals are within normal limits. The brainstem and cerebellum are within normal limits. Vascular: Flow is present in the major intracranial arteries. Skull and upper cervical spine: The craniocervical junction is normal. Upper cervical spine is within normal limits. Marrow signal is unremarkable. Sinuses/Orbits: The paranasal sinuses and mastoid air cells are clear. The globes and orbits are within normal limits. IMPRESSION: 1. Bilateral subacute subdural hematomas measuring 7.5 mm each. No significant mass effect is present. 2. Brain parenchyma is normal for age. Electronically Signed   By: Marin Robertshristopher  Mattern M.D.   On: 03/14/2020 23:01   MR CERVICAL SPINE WO CONTRAST  Result Date: 03/14/2020 CLINICAL DATA:  ) Bilateral upper extremity numbness difficulty walking. Symptoms improved after sitting down. Abnormal head CT with bilateral subdural hematomas. EXAM: MRI CERVICAL SPINE WITHOUT CONTRAST TECHNIQUE: Multiplanar, multisequence MR imaging of the cervical spine was performed. No intravenous contrast was administered. COMPARISON:  None. FINDINGS: Alignment: Slight retrolisthesis is present at  C5-6 and C6-7. No other significant listhesis is present. Normal lordosis is straightened. Vertebrae: Chronic endplate marrow changes are present at C5-6 and C6-7 Cord: Minimal T2 cord signal is present at C4-5 and C5-6 on the STIR sequence. The cord is narrowed at these levels. Cord morphology and signal is otherwise normal. Posterior Fossa, vertebral arteries, paraspinal tissues: Insert normal craniocervical Disc levels: C2-3: Advanced facet hypertrophy is present bilaterally. Uncovertebral spurring contributes to mild left foraminal narrowing. C3-4: Asymmetric right-sided uncovertebral facet hypertrophy result in severe right and mild left foraminal narrowing. C4-5: Broad-based disc osteophyte complex partially effaces the ventral CSF. Facet hypertrophy contributes to mild foraminal narrowing bilaterally. C5-6: A broad-based disc osteophyte complex is present. The canal is narrowed to 6.5 mm. Uncovertebral spurring results in severe left and moderate right foraminal narrowing. C6-7: A broad-based disc osteophyte complex is present. The canal is narrowed to 9 mm. Moderate foraminal stenosis is present bilaterally. C7-T1: Facet hypertrophy is present bilaterally. No significant disc protrusion or stenosis is present. IMPRESSION: 1. 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 2. Mild central canal narrowing at C6-7. 3. Severe right and mild left foraminal narrowing at C3-4. 4. Mild foraminal narrowing bilaterally at C4-5. 5. Severe left and moderate right foraminal narrowing at  C5-6. 6. Moderate foraminal narrowing bilaterally at C6-7. 7. Moderate degenerative changes at C5-6 and C6-7. 8. No acute trauma. Electronically Signed   By: Marin Roberts M.D.   On: 03/14/2020 23:06    EKG: Independently reviewed.  Assessment/Plan Principal Problem:   Stroke-like symptoms Active Problems:   HTN (hypertension)   SDH (subdural hematoma) (HCC)     1. Stroke-like symptoms - 1. Actually the bilateral symptoms, imaging findings, make me think it might be more of a Cord stenosis issue. 1. Call Dr. Franky Macho back in AM with these new findings. 2. MRI C spine had not yet been performed when he was initially consulted on the patients SDHs 3. Regardless, currently asymptomatic at this time. 2. TIA work up 3. Tele monitor 4. 2d echo 5. Carotid dopplers 6. Dr. Amada Jupiter to see pt 2. HTN - 1. Cont home BP meds 3. Chronic B SDHs / hygromas - 1. See Dr. Sueanne Margarita note 2. No need for surgery 3. Follow up outpt for these  DVT prophylaxis: SCDs Code Status: Full Family Communication: No family in room Disposition Plan: Home, possibly tomorrow Consults called: Dr. Amada Jupiter called, Dr. Franky Macho saw pt earlier in evening for the SDHs Admission status: Place in obs   Iracema Lanagan M. DO [facility]  How to contact the [facility] Attending or Consulting provider 7A - 7P or covering provider during after hours 7P -7A, for this patient?  1. Check the care team in [place] and look for a) attending/consulting [facility] provider listed and b) the [facility] team listed 2. Log into [email or web address]  Amion Physician Scheduling and messaging for groups and whole hospitals  On call and physician scheduling software for group practices, residents, hospitalists and other medical providers for call, clinic, rotation and shift schedules. On[employer] is a hospital-wide system for scheduling doctors and paging doctors on call. EasyPlot is for scientific plotting and data analysis.  [email or web address]  and use [place]'s universal password to access. If you do not have the password, please contact the hospital operator.  3. Locate the [facility] provider you are looking for under [facility] and page to a number that you can be directly reached. 4. If you still have difficulty reaching the provider, please page the [facility] (Director on Call) for the Hospitalists listed on  amion for assistance.  03/15/2020, 1:22 AM

## 2020-03-15 NOTE — ED Notes (Signed)
EEG has been completed at bedside

## 2020-03-15 NOTE — ED Notes (Signed)
Tele   Breakfast ordered  

## 2020-03-15 NOTE — Progress Notes (Signed)
Subjective: Patient admitted this morning, see detailed H&P by Dr Julian Reil 86 year old male with medical history of hypertension, came to hospital when he became off balance while walking.  Patient also had numbness in the entire right arm with weakness to the point where he had to physically lift his left arm.  This subsided over few hours and was resolved by the time he went to bed. CT head showed chronic subdural hematomas, neurosurgery was consulted And recommended no surgical intervention at this time.  Vitals:   03/15/20 0046 03/15/20 0758  BP: (!) 161/106 (!) 151/112  Pulse: 87 84  Resp: 23 (!) 11  Temp:    SpO2: 97% 94%      A/P  Cervical spinal stenosis-MRI C-spine obtained shows significant cervical spine stenosis with subtle T2  cord  signal suggesting edema. Neurology has consulted neurosurgery again. Will await neurosurgery recommendations.  Hypertension Continue ramipril, add hydralazine 25 mg p.o. every 6 hours as needed for BP more than 160/100.   Meredeth Ide [facility] [phone]

## 2020-03-15 NOTE — ED Notes (Signed)
Heart healthy lunch tray ordered 

## 2020-03-15 NOTE — Consult Note (Signed)
Neurology Consultation Reason for Consult: Recurrent episodes of neurological symptoms Referring Physician: Juleen China, S  CC: Recurrent episodes  History is obtained from: Patient  HPI: Ayaz Sondgeroth is a 86 y.o. male with a history of hypertension who presents with multiple episodes concerning for transient neurological events.  The first was about 3 weeks ago, when he had sudden loss of function of both arms.  This gradually improved over the period of a few hours.  He states that he had both numbness and weakness of his arms at that time.  Yesterday he was working on his lawn more when he had numbness of his entire right arm and weakness which again gradually improved over a few hours.  Today, he went for a walk and while he was walking he noticed that he began having trouble with his balance.  This was persistently increasing and difficulty to the point where he was having difficulty walking.  This again gradually improved over a few hours.      ROS: A 14 point ROS was performed and is negative except as noted in the HPI.   Past Medical History:  Diagnosis Date  . Hypertension      Family History  Problem Relation Age of Onset  . Healthy Mother   . COPD Father      Social History:  reports that he has never smoked. He has never used smokeless tobacco. He reports current alcohol use. He reports that he does not use drugs.   Exam: Current vital signs: BP (!) 161/106   Pulse 87   Temp 97.8 F (36.6 C) (Oral)   Resp 23   Ht 6' (1.829 m)   Wt 86.2 kg   SpO2 97%   BMI 25.77 kg/m  Vital signs in last 24 hours: Temp:  [97.8 F (36.6 C)] 97.8 F (36.6 C) (07/24 0808) Pulse Rate:  [67-87] 87 (07/25 0046) Resp:  [15-24] 23 (07/25 0046) BP: (118-166)/(84-106) 161/106 (07/25 0046) SpO2:  [97 %-100 %] 97 % (07/25 0046) Weight:  [86.2 kg] 86.2 kg (07/24 1549)   Physical Exam  Constitutional: Appears well-developed and well-nourished.  Psych: Affect appropriate to  situation Eyes: No scleral injection HENT: No OP obstrucion MSK: no joint deformities.  Cardiovascular: Normal rate and regular rhythm.  Respiratory: Effort normal, non-labored breathing GI: Soft.  No distension. There is no tenderness.  Skin: WDI  Neuro: Mental Status: Patient is awake, alert, oriented to person, place, month, year, and situation. Patient is able to give a clear and coherent history. No signs of aphasia or neglect Cranial Nerves: II: Visual Fields are full. Pupils are equal, round, and reactive to light.   III,IV, VI: EOMI without ptosis or diploplia.  V: Facial sensation is symmetric to temperature VII: Facial movement is symmetric.  VIII: hearing is intact to voice X: Uvula elevates symmetrically XI: Shoulder shrug is symmetric. XII: tongue is midline without atrophy or fasciculations.  Motor: Tone is normal. Bulk is normal. 5/5 strength was present in all four extremities.  Sensory: Sensation is symmetric to light touch and temperature in the arms and legs. Deep Tendon Reflexes: 2+ and symmetric in the biceps and patellae.  Plantars: Toes are downgoing on the right, upgoing on the left bilaterally.  Cerebellar: FNF and HKS are intact bilaterally   I have reviewed labs in epic and the results pertinent to this consultation are: BMP-unremarkable  I have reviewed the images obtained: MRI brain and cervical spine-cervical stenosis, bilateral subdurals  Impression: 86 year old  male with three episodes of distinct and different neurological symptoms over the course of 3 weeks.  Neurology has been asked to evaluate for consideration of seizures or other etiology of these events.  The prolonged nature, bilateral symptoms and difference of all three would argue against partial seizures as etiology of these events, but an EEG would be reasonable given that he has bilateral subdurals.  TIA could be possible, but this would be very unusual to cause bilateral arm  weakness although partial anterior spinal artery stroke can cause bilateral arm weakness with preserved leg weakness in rare cases.   Recommendations: 1) CTA head neck 2) EEG 3) discussion with neurosurgery regarding whether his cervical stenosis could be responsible for his transient symptoms.   Ritta Slot, MD [facility] [phone]  If 7pm- 7am, please page neurology on call as listed in [facility].

## 2020-03-15 NOTE — Evaluation (Addendum)
Physical Therapy Evaluation and [place] Patient Details Name: Tyrez Berrios MRN: [id number] DOB: Jun 16, 1933 Today's Date: 03/15/2020   History of Present Illness  Pt is an 86 y/o male admitted secondary to numbness in BUEs and difficulty walking. 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 PMH includes HTN.   Clinical Impression  Pt admitted secondary to problem above with deficits below. Pt performing mobility tasks at a supervision to independent level throughout. Able to perform DGI tasks without LOB. Did note some difficulty following commands to perform DGI tasks, however, unsure if because of HOH or if there were cognitive deficits. Otherwise, pt reports he is at baseline. Reports his wife can assist as needed. Will sign off. If needs change, please re-consult.     Follow Up Recommendations No PT follow up    Equipment Recommendations  None recommended by PT    Recommendations for Other Services       Precautions / Restrictions Precautions Precautions: None Restrictions Weight Bearing Restrictions: No      Mobility  Bed Mobility Overal bed mobility: Independent                Transfers Overall transfer level: Independent                  Ambulation/Gait Ambulation/Gait assistance: Supervision Gait Distance (Feet): 250 Feet Assistive device: None Gait Pattern/deviations: Step-through pattern;Decreased stride length Gait velocity: Decreased   General Gait Details: Drifted to the R slightly during ambulation and had some difficulty processing and sequencing to follow cues for dynamic gait activity. No overt LOB noted when performing DGI tasks. Pt reports he feels back at baseline for ambulation   Stairs            Wheelchair Mobility    Modified Rankin (Stroke Patients Only)       Balance Overall balance  assessment: Needs assistance Sitting-balance support: No upper extremity supported;Feet supported Sitting balance-Leahy Scale: Good     Standing balance support: No upper extremity supported;During functional activity Standing balance-Leahy Scale: Good                   Standardized Balance Assessment Standardized Balance Assessment : Dynamic Gait Index   Dynamic Gait Index Level Surface: Normal Change in Gait Speed: Normal Gait with Horizontal Head Turns: Mild Impairment Gait with Vertical Head Turns: Normal Gait and Pivot Turn: Normal Step Over Obstacle: Normal Step Around Obstacles: Normal       Pertinent Vitals/Pain Pain Assessment: No/denies pain    Home Living Family/patient expects to be discharged to:: Private residence Living Arrangements: Spouse/significant other Available Help at Discharge: Family;Available 24 hours/day Type of Home: House Home Access: Stairs to enter Entrance Stairs-Rails: Right;Left;Can reach both Entrance Stairs-Number of Steps: 2 Home Layout: Two level Home Equipment: None      Prior Function Level of Independence: Independent               Hand Dominance        Extremity/Trunk Assessment   Upper Extremity Assessment Upper Extremity Assessment: Defer to OT evaluation    Lower Extremity Assessment Lower Extremity Assessment: Overall WFL for tasks assessed    Cervical / Trunk Assessment Cervical / Trunk Assessment: Normal  Communication   Communication: HOH  Cognition Arousal/Alertness: Awake/alert Behavior During Therapy: WFL for tasks assessed/performed Overall Cognitive Status: No family/caregiver present to determine baseline cognitive functioning  General Comments: Pt with some difficulty sequencing and processing cues to perform DGI tasks. Unsure if it was because of [facility] or because of underlying cognitive deficits.       General Comments General comments  (skin integrity, edema, etc.): Performed marching task at EOB to ensure pt able to clear steps.     Exercises     Assessment/Plan    PT Assessment Patent does not need any further PT services  PT Problem List         PT Treatment Interventions      PT Goals (Current goals can be found in the Care Plan section)  Acute Rehab PT Goals Patient Stated Goal: to go home PT Goal Formulation: With patient Time For Goal Achievement: 03/15/20 Potential to Achieve Goals: Good    Frequency     Barriers to discharge        Co-evaluation               AM-PAC PT "6 Clicks" Mobility  Outcome Measure Help needed turning from your back to your side while in a flat bed without using bedrails?: None Help needed moving from lying on your back to sitting on the side of a flat bed without using bedrails?: None Help needed moving to and from a bed to a chair (including a wheelchair)?: None Help needed standing up from a chair using your arms (e.g., wheelchair or bedside chair)?: None Help needed to walk in hospital room?: None Help needed climbing 3-5 steps with a railing? : A Little 6 Click Score: 23    End of Session   Activity Tolerance: Patient tolerated treatment well Patient left: in bed;with call bell/phone within reach Nurse Communication: Mobility status PT Visit Diagnosis: Other symptoms and signs involving the nervous system (T01.601)    Time: 0932-3557 PT Time Calculation (min) (ACUTE ONLY): 13 min   Charges:   PT Evaluation $PT Eval Low Complexity: 1 Low          Cindee Salt, DPT  Acute Rehabilitation Services  Pager: [phone] Office: [phone]   [facility] Prom 03/15/2020, 9:06 AM

## 2020-03-15 NOTE — Discharge Summary (Signed)
Physician Discharge Summary  Melven Stockard ONG:[id number] DOB: 08-08-1933 DOA: 03/14/2020  PCP: Sheliah Hatch, MD  Admit date: 03/14/2020 Discharge date: 03/15/2020  Time spent: 50 minutes  Recommendations for Outpatient Follow-up:  1. Follow-up neurosurgery in 2 weeks   Discharge Diagnoses:  Principal Problem:   Stroke-like symptoms Active Problems:   HTN (hypertension)   SDH (subdural hematoma) (HCC)   Discharge Condition: Stable  Diet recommendation: Heart healthy diet  Filed Weights   03/14/20 1549  Weight: 86.2 kg    History of present illness:  86 year old male with medical history of hypertension, came to hospital when he became off balance while walking.  Patient also had numbness in the entire right arm with weakness to the point where he had to physically lift his left arm.  This subsided over few hours and was resolved by the time he went to bed. CT head showed chronic subdural hematomas, neurosurgery was consulted And recommended no surgical intervention at this time.  Hospital Course:   Left arm weakness-resolved, neurology was consulted, CTA head and neck shows moderate narrowing of distal right vertebral artery.  EEG negative.  Discussed with neurology, CT findings cannot explain the symptoms.  Patient has cervical spine stenosis and had numbness and weakness of left arm.  He will follow up with neurosurgery as outpatient.  Cervical spine stenosis-MRI C-spine obtained showed cervical spine stenosis with subtle T2 cord signal suggesting edema.  Neurosurgery evaluated the patient.  No acute surgical intervention needed.  Patient stable for discharge.  He will follow with Dr. Franky Macho in 2 weeks  Hypertension-blood pressure is elevated.  Patient takes ramipril at home.  We will add amlodipine 5 mg daily.  Follow-up PCP in 1 week   Procedures:    Consultations:  Neurosurgery  Neurology  Discharge Exam: Vitals:   03/15/20 1100 03/15/20 1245   BP: (!) 135/89 (!) 148/99  Pulse: 67 66  Resp: 19 14  Temp:    SpO2: 97% 99%     Discharge Instructions   Discharge Instructions    Diet - low sodium heart healthy   Complete by: As directed    Increase activity slowly   Complete by: As directed      Allergies as of 03/15/2020   No Known Allergies     Medication List    TAKE these medications   amLODipine 5 MG tablet Commonly known as: NORVASC Take 1 tablet (5 mg total) by mouth daily.   ramipril 10 MG capsule Commonly known as: ALTACE Take 1 capsule (10 mg total) by mouth daily.   tamsulosin 0.4 MG Caps capsule Commonly known as: FLOMAX Take 0.4 mg by mouth every 12 (twelve) hours.      No Known Allergies  Follow-up Information    Coletta Memos, MD Follow up in 2 week(s).   Specialty: Neurosurgery Why: please call the office to make an appointment Contact information: [address] [place] [address] [phone]        Sheliah Hatch, MD Follow up in 1 week(s).   Specialty: Family Medicine Contact information: [address] [place] [address] [phone]                The results of significant diagnostics from this hospitalization (including imaging, microbiology, ancillary and laboratory) are listed below for reference.    Significant Diagnostic Studies: CT ANGIO HEAD W OR WO CONTRAST  Result Date: 03/15/2020 CLINICAL DATA:  Transient ischemic attack EXAM: CT  ANGIOGRAPHY HEAD AND NECK TECHNIQUE: Multidetector CT imaging of the head and neck was performed using the standard protocol during bolus administration of intravenous contrast. Multiplanar CT image reconstructions and MIPs were obtained to evaluate the vascular anatomy. Carotid stenosis measurements (when applicable) are obtained utilizing NASCET criteria, using the distal internal carotid diameter as the denominator. CONTRAST:  11mL OMNIPAQUE IOHEXOL 350 MG/ML SOLN COMPARISON:  None. FINDINGS: CT  HEAD FINDINGS Brain: There is no mass, hemorrhage or extra-axial collection. The size and configuration of the ventricles and extra-axial CSF spaces are normal. There is no acute or chronic infarction. The brain parenchyma is normal. Skull: The visualized skull base, calvarium and extracranial soft tissues are normal. Sinuses/Orbits: No fluid levels or advanced mucosal thickening of the visualized paranasal sinuses. No mastoid or middle ear effusion. The orbits are normal. CTA NECK FINDINGS SKELETON: Multilevel degenerative disc disease and facet hypertrophy without bony spinal canal stenosis. Disc space narrowing is greatest at C5-6. OTHER NECK: Normal pharynx, larynx and major salivary glands. No cervical lymphadenopathy. Unremarkable thyroid gland. UPPER CHEST: No pneumothorax or pleural effusion. No nodules or masses. AORTIC ARCH: There is no calcific atherosclerosis of the aortic arch. There is no aneurysm, dissection or hemodynamically significant stenosis of the visualized portion of the aorta. Conventional 3 vessel aortic branching pattern. The visualized proximal subclavian arteries are widely patent. RIGHT CAROTID SYSTEM: Normal without aneurysm, dissection or stenosis. LEFT CAROTID SYSTEM: Normal without aneurysm, dissection or stenosis. VERTEBRAL ARTERIES: Left dominant configuration. Both origins are clearly patent. There is no dissection, occlusion or flow-limiting stenosis to the skull base (V1-V3 segments). CTA HEAD FINDINGS POSTERIOR CIRCULATION: --Vertebral arteries: Moderate narrowing of the distal right V4 segment. Normal left. --Inferior cerebellar arteries: Normal. --Basilar artery: Normal. --Superior cerebellar arteries: Normal. --Posterior cerebral arteries (PCA): Normal. ANTERIOR CIRCULATION: --Intracranial internal carotid arteries: Normal. --Anterior cerebral arteries (ACA): Normal. Both A1 segments are present. Patent anterior communicating artery (a-comm). --Middle cerebral arteries  (MCA): Normal. VENOUS SINUSES: As permitted by contrast timing, patent. ANATOMIC VARIANTS: None Review of the MIP images confirms the above findings. IMPRESSION: 1. No emergent large vessel occlusion or high-grade stenosis. 2. Moderate narrowing of the distal right vertebral artery V4 segment. 3. Aortic Atherosclerosis (ICD10-I70.0). Electronically Signed   By: Deatra Robinson M.D.   On: 03/15/2020 05:31   DG Chest 2 View  Result Date: 03/15/2020 CLINICAL DATA:  Altered gait, bilateral upper extremity numbness and weakness EXAM: CHEST - 2 VIEW COMPARISON:  None. FINDINGS: Frontal and lateral views of the chest demonstrate an unremarkable cardiac silhouette. No airspace disease, effusion, or pneumothorax. No acute bony abnormalities. IMPRESSION: 1. No acute intrathoracic process. Electronically Signed   By: Sharlet Salina M.D.   On: 03/15/2020 01:53   CT Head Wo Contrast  Addendum Date: 03/14/2020   ADDENDUM REPORT: 03/14/2020 17:04 ADDENDUM: Critical Value/emergent results were called by telephone at the time of interpretation on 03/14/2020 at 5:04 pm to provider [facility] , who verbally acknowledged these results. Electronically Signed   By: Kreg Shropshire M.D.   On: 03/14/2020 17:04   Result Date: 03/14/2020 CLINICAL DATA:  Neuro deficit, altered gait EXAM: CT HEAD WITHOUT CONTRAST TECHNIQUE: Contiguous axial images were obtained from the base of the skull through the vertex without intravenous contrast. COMPARISON:  None. FINDINGS: Brain: There are layering crescentic extra-axial collections over the bilateral frontoparietal convexities measuring up to 8 mm on the left and 5 mm on the right. Additional mixed attenuation collection across the left cerebellar convexity as well  measuring up to 6 mm in size. No acute intraparenchymal hemorrhage. No CT evidence of cortically based or large territory infarct is seen. No evidence of hydrocephaly. Basal cisterns are patent. No mass effect or midline shift.  Findings on a background of diffuse parenchymal volume loss and chronic microvascular angiopathy. Vascular: Atherosclerotic calcification of the carotid siphons and intradural vertebral arteries. No hyperdense vessel. Skull: No calvarial fracture or suspicious osseous lesion. No scalp swelling or hematoma. Sinuses/Orbits: Paranasal sinuses and mastoid air cells are predominantly clear. Rightward nasal septal deviation with a contacting right-sided nasal septal spur. Included orbital structures are unremarkable. Other: None. IMPRESSION: 1. Small layering attenuation crescentic extra-axial collections over the bilateral frontoparietal convexities measuring up to 8 mm on the left and 5 mm on the right. Similar collection across the left cerebellar convexity up to 6 mm in thickness. 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 No significant resulting mass effect or midline shift. 2. Background of diffuse parenchymal volume loss and chronic microvascular angiopathy. 3. Intracranial atherosclerosis. 4. Rightward nasal septal deviation with a contacting right-sided nasal septal spur. Currently attempting to contact the ordering provider, addendum will be submitted upon case discussion. Electronically Signed: By: Kreg Shropshire M.D. On: 03/14/2020 17:00   CT ANGIO NECK W OR WO CONTRAST  Result Date: 03/15/2020 CLINICAL DATA:  Transient ischemic attack EXAM: CT ANGIOGRAPHY HEAD AND NECK TECHNIQUE: Multidetector CT imaging of the head and neck was performed using the standard protocol during bolus administration of intravenous contrast. Multiplanar CT image reconstructions and MIPs were obtained to evaluate the vascular anatomy. Carotid stenosis measurements (when applicable) are obtained utilizing NASCET criteria, using the distal internal carotid diameter as the denominator. CONTRAST:  54mL OMNIPAQUE IOHEXOL 350 MG/ML SOLN COMPARISON:  None. FINDINGS: CT HEAD FINDINGS Brain: There is no mass,  hemorrhage or extra-axial collection. The size and configuration of the ventricles and extra-axial CSF spaces are normal. There is no acute or chronic infarction. The brain parenchyma is normal. Skull: The visualized skull base, calvarium and extracranial soft tissues are normal. Sinuses/Orbits: No fluid levels or advanced mucosal thickening of the visualized paranasal sinuses. No mastoid or middle ear effusion. The orbits are normal. CTA NECK FINDINGS SKELETON: Multilevel degenerative disc disease and facet hypertrophy without bony spinal canal stenosis. Disc space narrowing is greatest at C5-6. OTHER NECK: Normal pharynx, larynx and major salivary glands. No cervical lymphadenopathy. Unremarkable thyroid gland. UPPER CHEST: No pneumothorax or pleural effusion. No nodules or masses. AORTIC ARCH: There is no calcific atherosclerosis of the aortic arch. There is no aneurysm, dissection or hemodynamically significant stenosis of the visualized portion of the aorta. Conventional 3 vessel aortic branching pattern. The visualized proximal subclavian arteries are widely patent. RIGHT CAROTID SYSTEM: Normal without aneurysm, dissection or stenosis. LEFT CAROTID SYSTEM: Normal without aneurysm, dissection or stenosis. VERTEBRAL ARTERIES: Left dominant configuration. Both origins are clearly patent. There is no dissection, occlusion or flow-limiting stenosis to the skull base (V1-V3 segments). CTA HEAD FINDINGS POSTERIOR CIRCULATION: --Vertebral arteries: Moderate narrowing of the distal right V4 segment. Normal left. --Inferior cerebellar arteries: Normal. --Basilar artery: Normal. --Superior cerebellar arteries: Normal. --Posterior cerebral arteries (PCA): Normal. ANTERIOR CIRCULATION: --Intracranial internal carotid arteries: Normal. --Anterior cerebral arteries (ACA): Normal. Both A1 segments are present. Patent anterior communicating artery (a-comm). --Middle cerebral arteries (MCA): Normal. VENOUS SINUSES: As permitted  by contrast timing, patent. ANATOMIC VARIANTS: None Review of the MIP images confirms the above findings. IMPRESSION: 1. No emergent large vessel occlusion or high-grade stenosis.  2. Moderate narrowing of the distal right vertebral artery V4 segment. 3. Aortic Atherosclerosis (ICD10-I70.0). Electronically Signed   By: Deatra Robinson M.D.   On: 03/15/2020 05:31   MR BRAIN WO CONTRAST  Result Date: 03/14/2020 CLINICAL DATA:  TIA. Episode of sudden numbness in both upper extremities difficulty walking. Symptoms improved after sitting down. Abnormal CT scan bilateral extra-axial collections representing chronic subdural hematomas or hygromas. EXAM: MRI HEAD WITHOUT CONTRAST TECHNIQUE: Multiplanar, multiecho pulse sequences of the brain and surrounding structures were obtained without intravenous contrast. COMPARISON:  None. FINDINGS: Brain: Bilateral extra-axial collections demonstrate intermediate T1 signal and increased T2 signal. Fluid layers along the interhemispheric fissure is well. These likely represent subacute hemorrhage. Collections measures 7.5 mm each on coronal images. No mass effect is present. No acute infarct or parenchymal hemorrhage is present. No mass lesion is present. Mild periventricular and scattered subcortical T2 hyperintensities are present bilaterally, likely within normal limits for age. The ventricles are of normal size. The internal auditory canals are within normal limits. The brainstem and cerebellum are within normal limits. Vascular: Flow is present in the major intracranial arteries. Skull and upper cervical spine: The craniocervical junction is normal. Upper cervical spine is within normal limits. Marrow signal is unremarkable. Sinuses/Orbits: The paranasal sinuses and mastoid air cells are clear. The globes and orbits are within normal limits. IMPRESSION: 1. Bilateral subacute subdural hematomas measuring 7.5 mm each. No significant mass effect is present. 2. Brain parenchyma is  normal for age. Electronically Signed   By: Marin Roberts M.D.   On: 03/14/2020 23:01   MR CERVICAL SPINE WO CONTRAST  Result Date: 03/14/2020 CLINICAL DATA:  ) Bilateral upper extremity numbness difficulty walking. Symptoms improved after sitting down. Abnormal head CT with bilateral subdural hematomas. EXAM: MRI CERVICAL SPINE WITHOUT CONTRAST TECHNIQUE: Multiplanar, multisequence MR imaging of the cervical spine was performed. No intravenous contrast was administered. COMPARISON:  None. FINDINGS: Alignment: Slight retrolisthesis is present at C5-6 and C6-7. No other significant listhesis is present. Normal lordosis is straightened. Vertebrae: Chronic endplate marrow changes are present at C5-6 and C6-7 Cord: Minimal T2 cord signal is present at C4-5 and C5-6 on the STIR sequence. The cord is narrowed at these levels. Cord morphology and signal is otherwise normal. Posterior Fossa, vertebral arteries, paraspinal tissues: Insert normal craniocervical Disc levels: C2-3: Advanced facet hypertrophy is present bilaterally. Uncovertebral spurring contributes to mild left foraminal narrowing. C3-4: Asymmetric right-sided uncovertebral facet hypertrophy result in severe right and mild left foraminal narrowing. C4-5: Broad-based disc osteophyte complex partially effaces the ventral CSF. Facet hypertrophy contributes to mild foraminal narrowing bilaterally. C5-6: A broad-based disc osteophyte complex is present. The canal is narrowed to 6.5 mm. Uncovertebral spurring results in severe left and moderate right foraminal narrowing. C6-7: A broad-based disc osteophyte complex is present. The canal is narrowed to 9 mm. Moderate foraminal stenosis is present bilaterally. C7-T1: Facet hypertrophy is present bilaterally. No significant disc protrusion or stenosis is present. IMPRESSION: 1. 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 2. Mild central canal narrowing at  C6-7. 3. Severe right and mild left foraminal narrowing at C3-4. 4. Mild foraminal narrowing bilaterally at C4-5. 5. Severe left and moderate right foraminal narrowing at C5-6. 6. Moderate foraminal narrowing bilaterally at C6-7. 7. Moderate degenerative changes at C5-6 and C6-7. 8. No acute trauma. Electronically Signed   By: Marin Roberts M.D.   On: 03/14/2020 23:06   EEG adult  Result Date:  03/15/2020 Charlsie QuestYadav, Priyanka O, MD     03/15/2020  1:34 PM Patient Name: Park PopeJohn Russell Prichett MRN: [id number] Epilepsy Attending: Charlsie QuestPriyanka O Yadav Referring Physician/Provider: Dr Ritta SlotMcNeill Kirkpatrick Date: 03/15/2020 Duration: 25.07 mins Patient history:  86 y.o. male with a history of hypertension who presents with multiple episodes concerning for transient neurological events.  The first was about 3 weeks ago, when he had sudden loss of function of both arms.  This gradually improved over the period of a few hours.  He states that he had both numbness and weakness of his arms at that time.  Yesterday he was working on his lawn more when he had numbness of his entire right arm and weakness which again gradually improved over a few hours.  Today, he went for a walk and while he was walking he noticed that he began having trouble with his balance.  This was persistently increasing and difficulty to the point where he was having difficulty walking.  This again gradually improved over a few hours. EEG to evaluate for seizure. Level of alertness: Awake, asleep AEDs during EEG study: None Technical aspects: This EEG study was done with scalp electrodes positioned according to the 10-20 International system of electrode placement. Electrical activity was acquired at a sampling rate of 500Hz  and reviewed with a high frequency filter of 70Hz  and a low frequency filter of 1Hz . EEG data were recorded continuously and digitally stored. Description: The posterior dominant rhythm consists of 9 Hz activity of moderate voltage (25-35  uV) seen predominantly in posterior head regions, symmetric and reactive to eye opening and eye closing. Sleep was characterized by vertex waves, sleep spindles (12 to 14 Hz), maximal frontocentral region.  Hyperventilation and photic stimulation were not performed. IMPRESSION: This study is within normal limits. No seizures or epileptiform discharges were seen throughout the recording. Charlsie QuestPriyanka O Yadav    Microbiology: Recent Results (from the past 240 hour(s))  SARS Coronavirus 2 by RT PCR (hospital order, performed in [facility]) Nasopharyngeal Nasopharyngeal Swab     Status: None   Collection Time: 03/14/20  5:41 PM   Specimen: Nasopharyngeal Swab  Result Value Ref Range Status   SARS Coronavirus 2 NEGATIVE NEGATIVE Final    Comment: (NOTE) SARS-CoV-2 target nucleic acids are NOT DETECTED.  The SARS-CoV-2 RNA is generally detectable in upper and lower respiratory specimens during the acute phase of infection. The lowest concentration of SARS-CoV-2 viral copies this assay can detect is 250 copies / mL. A negative result does not preclude SARS-CoV-2 infection and should not be used as the sole basis for treatment or other patient management decisions.  A negative result may occur with improper specimen collection / handling, submission of specimen other than nasopharyngeal swab, presence of viral mutation(s) within the areas targeted by this assay, and inadequate number of viral copies (<250 copies / mL). A negative result must be combined with clinical observations, patient history, and epidemiological information.  Fact Sheet for Patients:   BoilerBrush.com.cyhttps://www.fda.gov/media/136312/download  Fact Sheet for Healthcare Providers: https://pope.com/https://www.fda.gov/media/136313/download  This test is not yet approved or  cleared by the [place] FDA and has been authorized for detection and/or diagnosis of SARS-CoV-2 by FDA under an Emergency Use Authorization (EUA).  This EUA will  remain in effect (meaning this test can be used) for the duration of the COVID-19 declaration under Section 564(b)(1) of the Act, 21 U.S.C. section 360bbb-3(b)(1), unless the authorization is terminated or revoked sooner.  Performed at [facility],  [address] [place], [place] [address]      Labs: Basic Metabolic Panel: Recent Labs  Lab 03/14/20 0820  NA 139  K 4.0  CL 104  CO2 26  GLUCOSE 106*  BUN 25*  CREATININE 1.03  CALCIUM 9.2   CBC: Recent Labs  Lab 03/14/20 0820  WBC 5.7  HGB 14.1  HCT 43.6  MCV 92.6  PLT 226    CBG: Recent Labs  Lab 03/14/20 0801  GLUCAP 96       Signed:  Meredeth Ide MD.  [facility] 03/15/2020, 4:26 PM

## 2020-03-17 ENCOUNTER — Other Ambulatory Visit: Payer: Self-pay | Admitting: Family Medicine

## 2020-03-20 ENCOUNTER — Encounter: Payer: Self-pay | Admitting: Family Medicine

## 2020-03-20 ENCOUNTER — Other Ambulatory Visit: Payer: Self-pay | Admitting: Family Medicine

## 2020-03-20 ENCOUNTER — Ambulatory Visit (INDEPENDENT_AMBULATORY_CARE_PROVIDER_SITE_OTHER): Payer: Medicare HMO | Admitting: Family Medicine

## 2020-03-20 ENCOUNTER — Other Ambulatory Visit: Payer: Self-pay

## 2020-03-20 VITALS — BP 122/80 | HR 69 | Temp 99.5°F | Resp 16 | Ht 72.0 in | Wt 192.5 lb

## 2020-03-20 DIAGNOSIS — S065XAA Traumatic subdural hemorrhage with loss of consciousness status unknown, initial encounter: Secondary | ICD-10-CM

## 2020-03-20 DIAGNOSIS — E785 Hyperlipidemia, unspecified: Secondary | ICD-10-CM

## 2020-03-20 DIAGNOSIS — R2990 Unspecified symptoms and signs involving the nervous system: Secondary | ICD-10-CM | POA: Diagnosis not present

## 2020-03-20 DIAGNOSIS — E663 Overweight: Secondary | ICD-10-CM | POA: Diagnosis not present

## 2020-03-20 DIAGNOSIS — I10 Essential (primary) hypertension: Secondary | ICD-10-CM

## 2020-03-20 DIAGNOSIS — S065X9A Traumatic subdural hemorrhage with loss of consciousness of unspecified duration, initial encounter: Secondary | ICD-10-CM

## 2020-03-20 NOTE — Assessment & Plan Note (Signed)
Currently resolved.  No residual deficits.  Will work on risk prevention w/ BP control and lipid lowering.  Pt expressed understanding and is in agreement w/ plan.

## 2020-03-20 NOTE — Patient Instructions (Signed)
Schedule your complete physical in 6 months No need for blood work today- the hospital did all of that! Switch from scrambled eggs to oatmeal to improve your cholesterol Add OTC Red Yeast Rice supplement to help lower the cholesterol If you have any other episodes of dizziness, weakness, numbness- please go back to the ER Call with any questions or concerns Have a great summer!!!

## 2020-03-20 NOTE — Assessment & Plan Note (Signed)
Chronic problem.  Well controlled today.  Pt would like to stop Amlodipine and continue on just his Ramipril 10mg  daily.  Will refill Ramipril and continue to monitor.

## 2020-03-20 NOTE — Assessment & Plan Note (Signed)
Unclear etiology.  Has f/u scheduled w/ Neurosurgery.

## 2020-03-20 NOTE — Assessment & Plan Note (Signed)
Pt's LDL 197 and well above goal.  At age 86 he is not interested in starting a statin but is willing to switch from daily scrambled eggs to oatmeal.  He is also willing to add Red Yeast Rice daily.  Will follow.

## 2020-03-20 NOTE — Progress Notes (Signed)
   Subjective:    Patient ID: Jack Randolph, male    DOB: 03/22/1933, 86 y.o.   MRN: [id number]  HPI Hospital f/u- pt was admitted 7/24-25 w/ stroke like sxs.  He was walking w/ his wife when he became off balance and had numbness and weakness of R arm (although H&P and D/C summary differ as to which arm) and pt reports bilateral sxs.  Head CT w/ chronic bilateral subdural hematomas, MRI of C spine showed severe stenosis.  Pt was completely asymptomatic prior to hospital d/c and was told to f/u w/ Neurosurg in 2 weeks.  Pt has not had any additional episodes.  Pt reports HAs this year 'due to allergies but they've lightened up a bit'.  No dizziness.  No N/V.  No blurry or double vision.  Hyperlipidemia- total cholesterol 258 and LDL 197.  Pt is not interested in statin due to possible side effects.  Pt eats scrambled eggs daily- wonders if he should switch to Oatmeal.  No CP, SOB, abd pain, N/V.  HTN- Amlodipine was added due to elevated BP.  Pt would like to stop the Amlodipine and just continue on the Ramipril  Reviewed ER note, H&P, D/C summary.  Reviewed imaging, consult notes.  Meds reconciled.   Review of Systems For ROS see HPI   This visit occurred during the SARS-CoV-2 public health emergency.  Safety protocols were in place, including screening questions prior to the visit, additional usage of staff PPE, and extensive cleaning of exam room while observing appropriate contact time as indicated for disinfecting solutions.       Objective:   Physical Exam Vitals reviewed.  Constitutional:      General: He is not in acute distress.    Appearance: Normal appearance. He is well-developed.  HENT:     Head: Normocephalic and atraumatic.  Eyes:     Conjunctiva/sclera: Conjunctivae normal.     Pupils: Pupils are equal, round, and reactive to light.  Neck:     Thyroid: No thyromegaly.  Cardiovascular:     Rate and Rhythm: Normal rate and regular rhythm.     Heart sounds:  Normal heart sounds. No murmur heard.   Pulmonary:     Effort: Pulmonary effort is normal. No respiratory distress.     Breath sounds: Normal breath sounds.  Abdominal:     General: Bowel sounds are normal. There is no distension.     Palpations: Abdomen is soft.  Musculoskeletal:     Cervical back: Normal range of motion and neck supple.  Lymphadenopathy:     Cervical: No cervical adenopathy.  Skin:    General: Skin is warm and dry.  Neurological:     Mental Status: He is alert and oriented to person, place, and time.     Cranial Nerves: No cranial nerve deficit.     Sensory: No sensory deficit.     Motor: No weakness.     Coordination: Coordination normal.     Gait: Gait normal.  Psychiatric:        Behavior: Behavior normal.           Assessment & Plan:

## 2020-03-30 DIAGNOSIS — I6203 Nontraumatic chronic subdural hemorrhage: Secondary | ICD-10-CM | POA: Diagnosis not present

## 2020-04-03 ENCOUNTER — Other Ambulatory Visit: Payer: Self-pay | Admitting: Neurosurgery

## 2020-04-03 DIAGNOSIS — I6203 Nontraumatic chronic subdural hemorrhage: Secondary | ICD-10-CM

## 2020-04-22 ENCOUNTER — Ambulatory Visit
Admission: RE | Admit: 2020-04-22 | Discharge: 2020-04-22 | Disposition: A | Discharge status: Home / Self Care | Payer: Medicare HMO | Source: Ambulatory Visit | Attending: Neurosurgery | Admitting: Neurosurgery

## 2020-04-22 DIAGNOSIS — I6201 Nontraumatic acute subdural hemorrhage: Secondary | ICD-10-CM | POA: Diagnosis not present

## 2020-04-22 DIAGNOSIS — I7090 Unspecified atherosclerosis: Secondary | ICD-10-CM | POA: Diagnosis not present

## 2020-04-22 DIAGNOSIS — S065X0A Traumatic subdural hemorrhage without loss of consciousness, initial encounter: Secondary | ICD-10-CM | POA: Diagnosis not present

## 2020-04-22 DIAGNOSIS — Q283 Other malformations of cerebral vessels: Secondary | ICD-10-CM | POA: Diagnosis not present

## 2020-04-22 DIAGNOSIS — I6203 Nontraumatic chronic subdural hemorrhage: Secondary | ICD-10-CM

## 2020-04-30 DIAGNOSIS — Z6826 Body mass index (BMI) 26.0-26.9, adult: Secondary | ICD-10-CM | POA: Diagnosis not present

## 2020-04-30 DIAGNOSIS — I6203 Nontraumatic chronic subdural hemorrhage: Secondary | ICD-10-CM | POA: Diagnosis not present

## 2020-05-27 ENCOUNTER — Other Ambulatory Visit: Payer: Self-pay | Admitting: Neurosurgery

## 2020-05-27 DIAGNOSIS — I6203 Nontraumatic chronic subdural hemorrhage: Secondary | ICD-10-CM

## 2020-05-28 ENCOUNTER — Ambulatory Visit
Admission: RE | Admit: 2020-05-28 | Discharge: 2020-05-28 | Disposition: A | Discharge status: Home / Self Care | Payer: Medicare HMO | Source: Ambulatory Visit | Attending: Neurosurgery | Admitting: Neurosurgery

## 2020-05-28 DIAGNOSIS — I6203 Nontraumatic chronic subdural hemorrhage: Secondary | ICD-10-CM

## 2020-05-28 DIAGNOSIS — G9389 Other specified disorders of brain: Secondary | ICD-10-CM | POA: Diagnosis not present

## 2020-05-28 DIAGNOSIS — I6201 Nontraumatic acute subdural hemorrhage: Secondary | ICD-10-CM | POA: Diagnosis not present

## 2020-05-28 DIAGNOSIS — I618 Other nontraumatic intracerebral hemorrhage: Secondary | ICD-10-CM | POA: Diagnosis not present

## 2020-05-28 DIAGNOSIS — S065X0A Traumatic subdural hemorrhage without loss of consciousness, initial encounter: Secondary | ICD-10-CM | POA: Diagnosis not present

## 2020-06-09 DIAGNOSIS — Z6826 Body mass index (BMI) 26.0-26.9, adult: Secondary | ICD-10-CM | POA: Diagnosis not present

## 2020-06-09 DIAGNOSIS — I10 Essential (primary) hypertension: Secondary | ICD-10-CM | POA: Diagnosis not present

## 2020-06-09 DIAGNOSIS — I6203 Nontraumatic chronic subdural hemorrhage: Secondary | ICD-10-CM | POA: Diagnosis not present

## 2020-09-21 ENCOUNTER — Ambulatory Visit (INDEPENDENT_AMBULATORY_CARE_PROVIDER_SITE_OTHER): Payer: Medicare HMO | Admitting: Family Medicine

## 2020-09-21 ENCOUNTER — Ambulatory Visit: Payer: Medicare HMO

## 2020-09-21 ENCOUNTER — Other Ambulatory Visit: Payer: Self-pay

## 2020-09-21 ENCOUNTER — Encounter: Payer: Self-pay | Admitting: Family Medicine

## 2020-09-21 VITALS — BP 128/80 | HR 66 | Temp 98.2°F | Resp 19 | Ht 71.0 in | Wt 203.6 lb

## 2020-09-21 DIAGNOSIS — Z0000 Encounter for general adult medical examination without abnormal findings: Secondary | ICD-10-CM

## 2020-09-21 DIAGNOSIS — Z125 Encounter for screening for malignant neoplasm of prostate: Secondary | ICD-10-CM | POA: Diagnosis not present

## 2020-09-21 DIAGNOSIS — I10 Essential (primary) hypertension: Secondary | ICD-10-CM | POA: Diagnosis not present

## 2020-09-21 LAB — BASIC METABOLIC PANEL
BUN: 28 mg/dL — ABNORMAL HIGH (ref 6–23)
CO2: 28 mEq/L (ref 19–32)
Calcium: 9.2 mg/dL (ref 8.4–10.5)
Chloride: 104 mEq/L (ref 96–112)
Creatinine, Ser: 1.1 mg/dL (ref 0.40–1.50)
GFR: 61.37 mL/min (ref >60.00)
Glucose, Bld: 86 mg/dL (ref 70–99)
Potassium: 4.1 mEq/L (ref 3.5–5.1)
Sodium: 138 mEq/L (ref 135–145)

## 2020-09-21 LAB — LIPID PANEL
Cholesterol: 230 mg/dL — ABNORMAL HIGH (ref 0–200)
HDL: 41.1 mg/dL (ref >39.00)
LDL Cholesterol: 155 mg/dL — ABNORMAL HIGH (ref 0–99)
NonHDL: 188.78
Total CHOL/HDL Ratio: 6
Triglycerides: 171 mg/dL — ABNORMAL HIGH (ref 0.0–149.0)
VLDL: 34.2 mg/dL (ref 0.0–40.0)

## 2020-09-21 LAB — CBC WITH DIFFERENTIAL/PLATELET
Basophils Absolute: 0 10*3/uL (ref 0.0–0.1)
Basophils Relative: 1 % (ref 0.0–3.0)
Eosinophils Absolute: 0.1 10*3/uL (ref 0.0–0.7)
Eosinophils Relative: 2.7 % (ref 0.0–5.0)
HCT: 41.2 % (ref 39.0–52.0)
Hemoglobin: 13.8 g/dL (ref 13.0–17.0)
Lymphocytes Relative: 31 % (ref 12.0–46.0)
Lymphs Abs: 1.4 10*3/uL (ref 0.7–4.0)
MCHC: 33.5 g/dL (ref 30.0–36.0)
MCV: 87.1 fl (ref 78.0–100.0)
Monocytes Absolute: 0.4 10*3/uL (ref 0.1–1.0)
Monocytes Relative: 8.1 % (ref 3.0–12.0)
Neutro Abs: 2.7 10*3/uL (ref 1.4–7.7)
Neutrophils Relative %: 57.2 % (ref 43.0–77.0)
Platelets: 197 10*3/uL (ref 150.0–400.0)
RBC: 4.73 Mil/uL (ref 4.22–5.81)
RDW: 13.9 % (ref 11.5–15.5)
WBC: 4.7 10*3/uL (ref 4.0–10.5)

## 2020-09-21 LAB — HEPATIC FUNCTION PANEL
ALT: 16 U/L (ref 0–53)
AST: 15 U/L (ref 0–37)
Albumin: 4.1 g/dL (ref 3.5–5.2)
Alkaline Phosphatase: 74 U/L (ref 39–117)
Bilirubin, Direct: 0.1 mg/dL (ref 0.0–0.3)
Total Bilirubin: 0.3 mg/dL (ref 0.2–1.2)
Total Protein: 7.2 g/dL (ref 6.0–8.3)

## 2020-09-21 LAB — TSH: TSH: 2.17 u[IU]/mL (ref 0.35–4.50)

## 2020-09-21 LAB — PSA, MEDICARE: PSA: 6.18 ng/ml — ABNORMAL HIGH (ref 0.10–4.00)

## 2020-09-21 NOTE — Progress Notes (Signed)
   Subjective:    Patient ID: Jack Randolph, male    DOB: 08-Feb-1933, 87 y.o.   MRN: [id number]  HPI CPE- UTD on immunizations.  No longer having colon cancer screening.  No concerns today, 'I seem to be doing pretty good'.  Reviewed past medical, surgical, family and social histories.   Patient Care Team    Relationship Specialty Notifications Start End  Sheliah Hatch, MD PCP - General Family Medicine  12/19/16   [facility], [facility]    06/14/17   Hildred Laser, MD  Urology  02/04/19     Health Maintenance  Topic Date Due  . TETANUS/TDAP  03/20/2021 (Originally 07/27/1954)  . INFLUENZA VACCINE  Completed  . COVID-19 Vaccine  Completed  . PNA vac Low Risk Adult  Completed      Review of Systems Patient reports no vision/hearing changes, anorexia, fever ,adenopathy, persistant/recurrent hoarseness, swallowing issues, chest pain, palpitations, edema, persistant/recurrent cough, hemoptysis, dyspnea (rest,exertional, paroxysmal nocturnal), gastrointestinal  bleeding (melena, rectal bleeding), abdominal pain, excessive heart burn, GU symptoms (dysuria, hematuria, voiding/incontinence issues) syncope, focal weakness, memory loss, numbness & tingling, skin/hair/nail changes, depression, anxiety, abnormal bruising/bleeding, musculoskeletal symptoms/signs.   This visit occurred during the SARS-CoV-2 public health emergency.  Safety protocols were in place, including screening questions prior to the visit, additional usage of staff PPE, and extensive cleaning of exam room while observing appropriate contact time as indicated for disinfecting solutions.       Objective:   Physical Exam General Appearance:    Alert, cooperative, no distress, appears stated age  Head:    Normocephalic, without obvious abnormality, atraumatic  Eyes:    PERRL, conjunctiva/corneas clear, EOM's intact, fundi    benign, both eyes       Ears:    Normal TM's and external ear canals, both  ears  Nose:   Deferred due to COVID  Throat:   Neck:   Supple, symmetrical, trachea midline, no adenopathy;       thyroid:  No enlargement/tenderness/nodules  Back:     Symmetric, no curvature, ROM normal, no CVA tenderness  Lungs:     Clear to auscultation bilaterally, respirations unlabored  Chest wall:    No tenderness or deformity  Heart:    Regular rate and rhythm, S1 and S2 normal, no murmur, rub   or gallop  Abdomen:     Soft, non-tender, bowel sounds active all four quadrants,    no masses, no organomegaly  Genitalia:    Deferred to urology  Rectal:    Extremities:   Extremities normal, atraumatic, no cyanosis or edema  Pulses:   2+ and symmetric all extremities  Skin:   Skin color, texture, turgor normal, no rashes or lesions  Lymph nodes:   Cervical, supraclavicular, and axillary nodes normal  Neurologic:   CNII-XII intact. Normal strength, sensation and reflexes      throughout          Assessment & Plan:

## 2020-09-21 NOTE — Assessment & Plan Note (Signed)
Pt's PE unchanged from previous w/ exception of 10 lb weight gain.  No longer doing colonoscopy.  UTD on immunizations.  Check labs.  Anticipatory guidance provided.

## 2020-09-21 NOTE — Patient Instructions (Addendum)
Follow up in 6 months to recheck BP and cholesterol We'll notify you of your lab results and make any changes if needed Continue to work on healthy diet and regular exercise- you can do it!! Call with any questions or concerns Stay Safe!  Stay Healthy! 

## 2020-09-21 NOTE — Assessment & Plan Note (Signed)
Chronic problem.  Well controlled on current meds.  Asymptomatic.  Check labs.  No anticipated med changes. 

## 2020-09-21 NOTE — Progress Notes (Deleted)
Subjective:   Jack Randolph is a 87 y.o. male who presents for Medicare Annual/Subsequent preventive examination.  Review of Systems    ***       Objective:    There were no vitals filed for this visit. There is no height or weight on file to calculate BMI.  Advanced Directives 06/14/2017  Does Patient Have a Medical Advance Directive? Yes  Type of Advance Directive Living will;Healthcare Power of Attorney  Copy of Healthcare Power of Attorney in Chart? No - copy requested    Current Medications (verified) Outpatient Encounter Medications as of 09/21/2020  Medication Sig  . Ascorbic Acid (VITAMIN C) 250 MG CHEW Chew by mouth.  . bisacodyl (DULCOLAX) 5 MG EC tablet Take 5 mg by mouth daily as needed for moderate constipation.  . Calcium Carbonate-Vitamin D (CALCIUM 500 + D PO) Take by mouth.  . Coenzyme Q10 (COQ10) 100 MG CAPS Take by mouth.  . Magnesium 400 MG TABS Take by mouth.  . Multiple Vitamins-Minerals (CENTRUM MULTI + OMEGA 3 PO) Take by mouth.  . Multiple Vitamins-Minerals (MULTIVITAMIN ADULT) CHEW Chew by mouth.  . ramipril (ALTACE) 10 MG capsule Take 1 capsule by mouth once daily  . tamsulosin (FLOMAX) 0.4 MG CAPS capsule Take 0.4 mg by mouth every 12 (twelve) hours.   No facility-administered encounter medications on file as of 09/21/2020.    Allergies (verified) Patient has no known allergies.   History: Past Medical History:  Diagnosis Date  . Hypertension    No past surgical history on file. Family History  Problem Relation Age of Onset  . Healthy Mother   . COPD Father    Social History   Socioeconomic History  . Marital status: Married    Spouse name: Not on file  . Number of children: 5  . Years of education: Not on file  . Highest education level: Not on file  Occupational History  . Occupation: Holiday representative  Tobacco Use  . Smoking status: Never Smoker  . Smokeless tobacco: Never Used  Vaping Use  . Vaping Use: Never used   Substance and Sexual Activity  . Alcohol use: Yes  . Drug use: No  . Sexual activity: Not on file  Other Topics Concern  . Not on file  Social History Narrative  . Not on file   Social Determinants of Health   Financial Resource Strain: Not on file  Food Insecurity: Not on file  Transportation Needs: Not on file  Physical Activity: Not on file  Stress: Not on file  Social Connections: Not on file    Tobacco Counseling Counseling given: Not Answered   Clinical Intake:                 Diabetic?***         Activities of Daily Living In your present state of health, do you have any difficulty performing the following activities: 03/20/2020  Hearing? N  Vision? N  Difficulty concentrating or making decisions? N  Walking or climbing stairs? N  Dressing or bathing? N  Doing errands, shopping? N  Some recent data might be hidden    Patient Care Team: Sheliah Hatch, MD as PCP - General (Family Medicine) [facility] Hildred Laser, MD (Urology)  Indicate any recent Medical Services you may have received from other than [facility] providers in the past year (date may be approximate).     Assessment:   This is a routine wellness examination for Jack Randolph.  Hearing/Vision screen No exam data present  Dietary issues and exercise activities discussed:    Goals    . patient     Maintain current health by staying active.       Depression Screen PHQ 2/9 Scores 03/20/2020 02/01/2019 06/14/2017 06/14/2017 12/19/2016  PHQ - 2 Score 0 0 0 0 0  PHQ- 9 Score - 0 - 0 0    Fall Risk Fall Risk  03/20/2020 02/01/2019 06/14/2017 06/14/2017 12/19/2016  Falls in the past year? 0 0 No No No  Number falls in past yr: 0 0 - - -  Injury with Fall? 0 0 - - -  Follow up Falls evaluation completed - - - -    FALL RISK PREVENTION PERTAINING TO THE HOME:  Any stairs in or around the home? {YES/NO:21197} If so, are there any without handrails?  {YES/NO:21197} Home free of loose throw rugs in walkways, pet beds, electrical cords, etc? {YES/NO:21197} Adequate lighting in your home to reduce risk of falls? {YES/NO:21197}  ASSISTIVE DEVICES UTILIZED TO PREVENT FALLS:  Life alert? {YES/NO:21197} Use of a cane, walker or w/c? {YES/NO:21197} Grab bars in the bathroom? {YES/NO:21197} Shower chair or bench in shower? {YES/NO:21197} Elevated toilet seat or a handicapped toilet? {YES/NO:21197}  TIMED UP AND GO:  Was the test performed? {YES/NO:21197}.  Length of time to ambulate 10 feet: *** sec.   {Appearance of Gait:2101803}  Cognitive Function: MMSE - Mini Mental State Exam 06/14/2017  Orientation to time 5  Orientation to Place 5  Registration 3  Attention/ Calculation 3  Recall 3  Language- name 2 objects 2  Language- repeat 1  Language- follow 3 step command 3  Language- read & follow direction 1  Write a sentence 1  Copy design 1  Total score 28        Immunizations Immunization History  Administered Date(s) Administered  . Influenza, High Dose Seasonal PF 04/22/2019, 04/21/2020  . Influenza,inj,Quad PF,6+ Mos 04/11/2017, 04/11/2017  . Influenza-Unspecified 04/25/2018  . PFIZER(Purple Top)SARS-COV-2 Vaccination 09/13/2019, 10/04/2019, 06/08/2020  . Pneumococcal Conjugate-13 04/17/2015  . Pneumococcal Polysaccharide-23 03/22/2016    TDAP status: Due, Education has been provided regarding the importance of this vaccine. Advised may receive this vaccine at local pharmacy or Health Dept. Aware to provide a copy of the vaccination record if obtained from local pharmacy or Health Dept. Verbalized acceptance and understanding.  Flu Vaccine status: Up to date  Pneumococcal vaccine status: Up to date  Covid-19 vaccine status: Completed vaccines  Qualifies for Shingles Vaccine? Yes   Zostavax completed No   Shingrix Completed?: No.    Education has been provided regarding the importance of this vaccine. Patient  has been advised to call insurance company to determine out of pocket expense if they have not yet received this vaccine. Advised may also receive vaccine at local pharmacy or Health Dept. Verbalized acceptance and understanding.  Screening Tests Health Maintenance  Topic Date Due  . TETANUS/TDAP  03/20/2021 (Originally 07/27/1954)  . INFLUENZA VACCINE  Completed  . COVID-19 Vaccine  Completed  . PNA vac Low Risk Adult  Completed    Health Maintenance  There are no preventive care reminders to display for this patient.  Colorectal cancer screening: No longer required.   Lung Cancer Screening: (Low Dose CT Chest recommended if Age 40-80 years, 30 pack-year currently smoking OR have quit w/in 15years.) does not qualify.     Additional Screening:  Hepatitis C Screening: does not qualify  Vision Screening: Recommended annual ophthalmology  exams for early detection of glaucoma and other disorders of the eye. Is the patient up to date with their annual eye exam?  {YES/NO:21197} Who is the provider or what is the name of the office in which the patient attends annual eye exams? *** If pt is not established with a provider, would they like to be referred to a provider to establish care? {YES/NO:21197}.   Dental Screening: Recommended annual dental exams for proper oral hygiene  Community Resource Referral / Chronic Care Management: CRR required this visit?  {YES/NO:21197}  CCM required this visit?  {YES/NO:21197}     Plan:     I have personally reviewed and noted the following in the patient's chart:   . Medical and social history . Use of alcohol, tobacco or illicit drugs  . Current medications and supplements . Functional ability and status . Nutritional status . Physical activity . Advanced directives . List of other physicians . Hospitalizations, surgeries, and ER visits in previous 12 months . Vitals . Screenings to include cognitive, depression, and  falls . Referrals and appointments  In addition, I have reviewed and discussed with patient certain preventive protocols, quality metrics, and best practice recommendations. A written personalized care plan for preventive services as well as general preventive health recommendations were provided to patient.     Roanna Raider, LPN   12/18/7679  Nurse Health Advisor  Nurse Notes: ***

## 2020-09-22 ENCOUNTER — Other Ambulatory Visit: Payer: Self-pay | Admitting: *Deleted

## 2020-09-22 DIAGNOSIS — R972 Elevated prostate specific antigen [PSA]: Secondary | ICD-10-CM

## 2020-09-30 DIAGNOSIS — N401 Enlarged prostate with lower urinary tract symptoms: Secondary | ICD-10-CM | POA: Diagnosis not present

## 2020-09-30 DIAGNOSIS — R3914 Feeling of incomplete bladder emptying: Secondary | ICD-10-CM | POA: Diagnosis not present

## 2020-09-30 DIAGNOSIS — R972 Elevated prostate specific antigen [PSA]: Secondary | ICD-10-CM | POA: Diagnosis not present

## 2020-11-12 ENCOUNTER — Telehealth: Payer: Self-pay | Admitting: Family Medicine

## 2020-11-12 NOTE — Telephone Encounter (Signed)
Spoke with patient wife he requested a CB to schedule AWV

## 2020-11-25 ENCOUNTER — Telehealth: Payer: Self-pay | Admitting: Family Medicine

## 2020-11-25 NOTE — Telephone Encounter (Signed)
Left message for patient to schedule Annual Wellness Visit.  Please schedule with Nurse Health Advisor Martha Stanley, RN at [place]  

## 2020-12-15 ENCOUNTER — Other Ambulatory Visit: Payer: Self-pay | Admitting: Family Medicine

## 2020-12-16 ENCOUNTER — Other Ambulatory Visit: Payer: Self-pay

## 2020-12-16 ENCOUNTER — Telehealth: Payer: Self-pay | Admitting: Family Medicine

## 2020-12-16 ENCOUNTER — Telehealth: Payer: Self-pay

## 2020-12-16 DIAGNOSIS — N319 Neuromuscular dysfunction of bladder, unspecified: Secondary | ICD-10-CM

## 2020-12-16 MED ORDER — TAMSULOSIN HCL 0.4 MG PO CAPS: 0.4000 mg | ORAL_CAPSULE | Freq: Two times a day (BID) | ORAL | 1 refills | Status: DC

## 2020-12-16 NOTE — Telephone Encounter (Signed)
Patient would like for you to call him in some flomax - generic - [facility] on [address]

## 2020-12-16 NOTE — Telephone Encounter (Signed)
Ok to refill the Flomax, #90, 1 refill

## 2020-12-16 NOTE — Telephone Encounter (Signed)
Rx filled and sent to patient pharmacy. Called patient and notified him. Pt understood. No further concerns at this time.

## 2020-12-16 NOTE — Telephone Encounter (Signed)
Message has been forwarded to pcp

## 2020-12-16 NOTE — Telephone Encounter (Signed)
Spoke with patient and he stated that he did not know that wife called and requested the flomax. He stated that [facility] fills this for him and does not mind doing so, but if you can fill it they would be ok as well.

## 2021-02-17 ENCOUNTER — Encounter: Payer: Self-pay | Admitting: *Deleted

## 2021-03-01 ENCOUNTER — Telehealth: Payer: Self-pay | Admitting: *Deleted

## 2021-03-01 NOTE — Chronic Care Management (AMB) (Signed)
  Chronic Care Management   Outreach Note  03/01/2021 Name: Maurizio Geno MRN: [id number] DOB: 24-Jul-1933  Judithann Graves Gahm is a 87 y.o. year old male who is a primary care patient of Beverely Low, Helane Rima, MD. I reached out to [name] by phone today in response to a referral sent by Mr. Salvadore Valvano Godwin's PCP  Sheliah Hatch, MD     An unsuccessful telephone outreach was attempted today. The patient was referred to the case management team for assistance with care management and care coordination.   Follow Up Plan: A HIPAA compliant phone message was left for the patient providing contact information and requesting a return call.  If patient returns call to provider office, please advise to call Embedded Care Management Care Guide Shenika Quint at [phone]  Burman Nieves, CCMA Care Guide, Embedded Care Coordination [facility]  Care Management  Direct Dial: [phone]

## 2021-03-10 NOTE — Chronic Care Management (AMB) (Signed)
  Chronic Care Management   Note  03/10/2021 Name: Jack Randolph MRN: [id number] DOB: 1933/08/19  Jack Randolph is a 87 y.o. year old male who is a primary care patient of Birdie Riddle, Aundra Millet, MD. I reached out to Burna Mortimer by phone today in response to a referral sent by Jack Randolph's PCP Midge Minium, MD     Jack Randolph was given information about Chronic Care Management services today including:  CCM service includes personalized support from designated clinical staff supervised by his physician, including individualized plan of care and coordination with other care providers 24/7 contact phone numbers for assistance for urgent and routine care needs. Service will only be billed when office clinical staff spend 20 minutes or more in a month to coordinate care. Only one practitioner may furnish and bill the service in a calendar month. The patient may stop CCM services at any time (effective at the end of the month) by phone call to the office staff. The patient will be responsible for cost sharing (co-pay) of up to 20% of the service fee (after annual deductible is met).  Patient agreed to services and verbal consent obtained.   Follow up plan: Telephone appointment with care management team member scheduled for: 03/31/2021  Jack Randolph, [place] Management  Direct Dial: [phone]

## 2021-03-29 ENCOUNTER — Telehealth: Payer: Self-pay | Admitting: Family Medicine

## 2021-03-29 NOTE — Telephone Encounter (Signed)
Left message for patient to call back and schedule Medicare Annual Wellness Visit (AWV).   Please offer to do virtually or by telephone. Patient can reach me directly at [phone].  Last AWV:06/14/2017  Please schedule at anytime with Nurse Health Advisor.

## 2021-03-31 ENCOUNTER — Telehealth: Payer: Medicare HMO

## 2021-05-10 ENCOUNTER — Telehealth: Payer: Self-pay | Admitting: Family Medicine

## 2021-05-10 NOTE — Telephone Encounter (Signed)
Left message for patient to call back and schedule Medicare Annual Wellness Visit (AWV) either virtually or in office.   Last AWV ;06/14/17 please schedule at anytime with health coach

## 2021-06-21 ENCOUNTER — Other Ambulatory Visit: Payer: Self-pay

## 2021-06-21 ENCOUNTER — Encounter: Payer: Self-pay | Admitting: Family Medicine

## 2021-06-21 ENCOUNTER — Ambulatory Visit (INDEPENDENT_AMBULATORY_CARE_PROVIDER_SITE_OTHER): Payer: Medicare HMO | Admitting: Family Medicine

## 2021-06-21 VITALS — BP 110/72 | HR 67 | Temp 97.5°F | Resp 16 | Wt 191.6 lb

## 2021-06-21 DIAGNOSIS — S065X9A Traumatic subdural hemorrhage with loss of consciousness of unspecified duration, initial encounter: Secondary | ICD-10-CM | POA: Insufficient documentation

## 2021-06-21 DIAGNOSIS — I10 Essential (primary) hypertension: Secondary | ICD-10-CM | POA: Diagnosis not present

## 2021-06-21 DIAGNOSIS — E663 Overweight: Secondary | ICD-10-CM

## 2021-06-21 DIAGNOSIS — E785 Hyperlipidemia, unspecified: Secondary | ICD-10-CM

## 2021-06-21 DIAGNOSIS — K429 Umbilical hernia without obstruction or gangrene: Secondary | ICD-10-CM

## 2021-06-21 NOTE — Patient Instructions (Addendum)
Please schedule a lab visit at your convenience Schedule your complete physical in 6 months We'll notify you of your lab results and make any changes if needed No med changes at this time Continue to work on healthy diet and regular physical activity Call with any questions or concerns Stay Safe!  Stay Healthy!

## 2021-06-21 NOTE — Progress Notes (Signed)
   Subjective:    Patient ID: Jack Randolph, male    DOB: 18-Nov-1932, 87 y.o.   MRN: [id number]  HPI HTN- chronic problem, on Ramipril 10mg  daily.  Not taking his Amlodipine.  BP excellent today at 110/72  no CP, SOB, HAs, visual changes, edema.  Hyperlipidemia- chronic problem, last LDL 155.  Pt has been resistant to meds in the past.  No abd pain, N/V.  Overweight- pt is down 4 lbs since last visit.  BMI is 26.72  Walking daily- nearly 5 miles.  Umbilical hernia- pt wears abd support to 'keep it in'.  Not painful.  Pt states this has been present x2 yrs after 'straining in the bathroom'   Review of Systems For ROS see HPI   This visit occurred during the SARS-CoV-2 public health emergency.  Safety protocols were in place, including screening questions prior to the visit, additional usage of staff PPE, and extensive cleaning of exam room while observing appropriate contact time as indicated for disinfecting solutions.      Objective:   Physical Exam Vitals reviewed.  Constitutional:      General: He is not in acute distress.    Appearance: Normal appearance. He is well-developed. He is not ill-appearing.  HENT:     Head: Normocephalic and atraumatic.  Eyes:     Extraocular Movements: Extraocular movements intact.     Conjunctiva/sclera: Conjunctivae normal.     Pupils: Pupils are equal, round, and reactive to light.  Neck:     Thyroid: No thyromegaly.  Cardiovascular:     Rate and Rhythm: Normal rate and regular rhythm.     Pulses: Normal pulses.     Heart sounds: Normal heart sounds. No murmur heard. Pulmonary:     Effort: Pulmonary effort is normal. No respiratory distress.     Breath sounds: Normal breath sounds.  Abdominal:     General: Bowel sounds are normal. There is no distension.     Palpations: Abdomen is soft.     Hernia: A hernia (large umbilical hernia) is present.  Musculoskeletal:     Cervical back: Normal range of motion and neck supple.     Right  lower leg: No edema.     Left lower leg: No edema.  Lymphadenopathy:     Cervical: No cervical adenopathy.  Skin:    General: Skin is warm and dry.  Neurological:     General: No focal deficit present.     Mental Status: He is alert and oriented to person, place, and time.     Cranial Nerves: No cranial nerve deficit.  Psychiatric:        Mood and Affect: Mood normal.        Behavior: Behavior normal.          Assessment & Plan:

## 2021-06-21 NOTE — Assessment & Plan Note (Signed)
Chronic problem.  On Ramipril 10mg  daily.  Not taking Amlodipine but BP is well controlled.  He is asymptomatic.  Check labs due to ACE but no anticipated med changes.

## 2021-06-21 NOTE — Assessment & Plan Note (Signed)
Pt is down 4 lbs since last visit.  Walking nearly 5 miles/day.  Applauded his efforts.  Will follow.

## 2021-06-21 NOTE — Assessment & Plan Note (Signed)
Chronic problem.  Last LDL 155.  He was advised to start Red Yeast Rice but that did not happen.  He has been resistant to meds in the past.  Check labs and start meds if needed.

## 2021-06-21 NOTE — Assessment & Plan Note (Signed)
New to provider, ongoing for pt.  Large, easily reducible.  No TTP.  Since this is not bothering pt there is no need for repair.

## 2021-08-30 ENCOUNTER — Other Ambulatory Visit: Payer: Self-pay | Admitting: Family Medicine

## 2021-10-29 ENCOUNTER — Other Ambulatory Visit: Payer: Self-pay | Admitting: Family Medicine

## 2021-12-13 ENCOUNTER — Ambulatory Visit (INDEPENDENT_AMBULATORY_CARE_PROVIDER_SITE_OTHER): Payer: Medicare HMO | Admitting: Family Medicine

## 2021-12-13 ENCOUNTER — Encounter: Payer: Self-pay | Admitting: Family Medicine

## 2021-12-13 VITALS — BP 110/60 | HR 65 | Temp 97.6°F | Resp 16 | Ht 71.0 in | Wt 197.6 lb

## 2021-12-13 DIAGNOSIS — Z0000 Encounter for general adult medical examination without abnormal findings: Secondary | ICD-10-CM

## 2021-12-13 DIAGNOSIS — I10 Essential (primary) hypertension: Secondary | ICD-10-CM

## 2021-12-13 LAB — CBC WITH DIFFERENTIAL/PLATELET
Basophils Absolute: 0.1 10*3/uL (ref 0.0–0.1)
Basophils Relative: 1.3 % (ref 0.0–3.0)
Eosinophils Absolute: 0.2 10*3/uL (ref 0.0–0.7)
Eosinophils Relative: 3.9 % (ref 0.0–5.0)
HCT: 42.8 % (ref 39.0–52.0)
Hemoglobin: 14.1 g/dL (ref 13.0–17.0)
Lymphocytes Relative: 36.4 % (ref 12.0–46.0)
Lymphs Abs: 1.9 10*3/uL (ref 0.7–4.0)
MCHC: 33 g/dL (ref 30.0–36.0)
MCV: 88.7 fl (ref 78.0–100.0)
Monocytes Absolute: 0.4 10*3/uL (ref 0.1–1.0)
Monocytes Relative: 7.9 % (ref 3.0–12.0)
Neutro Abs: 2.6 10*3/uL (ref 1.4–7.7)
Neutrophils Relative %: 50.5 % (ref 43.0–77.0)
Platelets: 199 10*3/uL (ref 150.0–400.0)
RBC: 4.82 Mil/uL (ref 4.22–5.81)
RDW: 14.2 % (ref 11.5–15.5)
WBC: 5.1 10*3/uL (ref 4.0–10.5)

## 2021-12-13 LAB — HEPATIC FUNCTION PANEL
ALT: 13 U/L (ref 0–53)
AST: 14 U/L (ref 0–37)
Albumin: 4.1 g/dL (ref 3.5–5.2)
Alkaline Phosphatase: 77 U/L (ref 39–117)
Bilirubin, Direct: 0.1 mg/dL (ref 0.0–0.3)
Total Bilirubin: 0.4 mg/dL (ref 0.2–1.2)
Total Protein: 7.2 g/dL (ref 6.0–8.3)

## 2021-12-13 LAB — LIPID PANEL
Cholesterol: 227 mg/dL — ABNORMAL HIGH (ref 0–200)
HDL: 42 mg/dL (ref >39.00)
LDL Cholesterol: 157 mg/dL — ABNORMAL HIGH (ref 0–99)
NonHDL: 184.67
Total CHOL/HDL Ratio: 5
Triglycerides: 140 mg/dL (ref 0.0–149.0)
VLDL: 28 mg/dL (ref 0.0–40.0)

## 2021-12-13 LAB — BASIC METABOLIC PANEL
BUN: 34 mg/dL — ABNORMAL HIGH (ref 6–23)
CO2: 30 mEq/L (ref 19–32)
Calcium: 8.9 mg/dL (ref 8.4–10.5)
Chloride: 105 mEq/L (ref 96–112)
Creatinine, Ser: 1.13 mg/dL (ref 0.40–1.50)
GFR: 58.91 mL/min — ABNORMAL LOW (ref >60.00)
Glucose, Bld: 96 mg/dL (ref 70–99)
Potassium: 4.1 mEq/L (ref 3.5–5.1)
Sodium: 140 mEq/L (ref 135–145)

## 2021-12-13 LAB — TSH: TSH: 1.79 u[IU]/mL (ref 0.35–5.50)

## 2021-12-13 MED ORDER — OMEPRAZOLE 20 MG PO CPDR: 20.0000 mg | DELAYED_RELEASE_CAPSULE | Freq: Every day | ORAL | 3 refills | Status: DC

## 2021-12-13 NOTE — Assessment & Plan Note (Signed)
Pt's PE WNL.  UTD on PNA, flu.  Due for Tdap at pharmacy.  Check labs.  Anticipatory guidance provided.  ?

## 2021-12-13 NOTE — Progress Notes (Signed)
? ?  Subjective:  ? ? Patient ID: Jack Randolph, male    DOB: [birth year], 88 y.o.   MRN: [id number] ? ?HPI ?CPE- UTD on PNA, flu.  Due for Tdap at the pharmacy.  No longer requiring colon cancer screen or PSA ? ?Patient Care Team  ?  Relationship Specialty Notifications Start End  ?Midge Minium, MD PCP - General Family Medicine  12/19/16   ?Ceasar Mons, MD Consulting Physician Urology  09/22/20   ?Dimitri Ped, [facility]   03/10/21   ? Comment: [phone]  ?  ?Health Maintenance  ?Topic Date Due  ? TETANUS/TDAP  Never done  ? Zoster Vaccines- Shingrix (2 of 2) 02/04/2022  ? INFLUENZA VACCINE  03/22/2022  ? Pneumonia Vaccine 64+ Years old  Completed  ? COVID-19 Vaccine  Completed  ? HPV VACCINES  Aged Out  ?  ? ? ?Review of Systems ?Patient reports no vision/hearing changes, anorexia, fever ,adenopathy, persistant/recurrent hoarseness, swallowing issues, chest pain, palpitations, edema, persistant/recurrent cough, hemoptysis, dyspnea (rest,exertional, paroxysmal nocturnal), gastrointestinal  bleeding (melena, rectal bleeding), abdominal pain, GU symptoms (dysuria, hematuria, voiding/incontinence issues) syncope, focal weakness, memory loss, numbness & tingling, skin/hair/nail changes, depression, anxiety, abnormal bruising/bleeding, musculoskeletal symptoms/signs.  ? ?+ GERD- took wife's Omeprazole on a few occasions w/ relief ?   ?Objective:  ? Physical Exam ?General Appearance:    Alert, cooperative, no distress, appears stated age  ?Head:    Normocephalic, without obvious abnormality, atraumatic  ?Eyes:    PERRL, conjunctiva/corneas clear, EOM's intact both eyes       ?Ears:    Normal TM's and external ear canals, both ears  ?Nose:   Nares normal, septum midline, mucosa normal, no drainage ?  or sinus tenderness  ?Throat:   Lips, mucosa, and tongue normal; teeth and gums normal  ?Neck:   Supple, symmetrical, trachea midline, no adenopathy;     ?   thyroid:  No enlargement/tenderness/nodules  ?Back:     Symmetric, no curvature, ROM normal, no CVA tenderness  ?Lungs:     Clear to auscultation bilaterally, respirations unlabored  ?Chest wall:    No tenderness or deformity  ?Heart:    Regular rate and rhythm, S1 and S2 normal, no murmur, rub ?  or gallop  ?Abdomen:     Soft, non-tender, bowel sounds active all four quadrants,  ?  no masses, no organomegaly  ?Genitalia:    Deferred to urology  ?Rectal:    ?Extremities:   Extremities normal, atraumatic, no cyanosis or edema  ?Pulses:   2+ and symmetric all extremities  ?Skin:   Skin color, texture, turgor normal, no rashes or lesions  ?Lymph nodes:   Cervical, supraclavicular, and axillary nodes normal  ?Neurologic:   CNII-XII intact. Normal strength, sensation and reflexes    ?  throughout  ?  ? ? ? ?   ?Assessment & Plan:  ? ? ?

## 2021-12-13 NOTE — Assessment & Plan Note (Signed)
Chronic problem.  Excellent control today on Ramipril.  Check labs due to ACE but no anticipated med changes. ?

## 2021-12-13 NOTE — Patient Instructions (Signed)
Follow up in 6 months to recheck BP ?We'll notify you of your lab results and make any changes if needed ?Start the Omeprazole as needed for heat burn/reflux ?Keep up the good work on healthy diet and regular exercise- you look great!!! ?Call with any questions or concerns ?Stay Safe!  Stay Healthy! ?Happy Spring!!! ?

## 2021-12-14 ENCOUNTER — Telehealth: Payer: Self-pay

## 2021-12-14 NOTE — Telephone Encounter (Signed)
We could start Atorvastatin 20mg  daily (a different statin) OR Zetia 10mg  daily which is an entirely different class of medication to lower cholesterol. ?

## 2021-12-14 NOTE — Telephone Encounter (Signed)
-----   Message from Sheliah Hatch, MD sent at 12/14/2021  7:40 AM EDT ----- ?Your total cholesterol and LDL (bad cholesterol) remain high.  This does put you at higher risk of heart attack and stroke but at your age, I will let you decide if you want to start a cholesterol medicine.  You have earned the right to choose! ? ?Remainder of labs look great! ?

## 2021-12-14 NOTE — Telephone Encounter (Signed)
Patient aware of labs, he stated that he is not a fan of crestor but is open to other options. He wants to know what other recommendations for cholesterol medicine Dr Beverely Low has.  ?

## 2021-12-15 MED ORDER — ATORVASTATIN CALCIUM 20 MG PO TABS: 20.0000 mg | ORAL_TABLET | Freq: Every day | ORAL | 1 refills | Status: DC

## 2021-12-15 NOTE — Telephone Encounter (Signed)
Pt's wife is aware this has been sent to the pharmacy  ?

## 2021-12-15 NOTE — Telephone Encounter (Signed)
Prescription sent for pt to pick up at the pharmacy.  Will follow up in 6 months ?

## 2021-12-15 NOTE — Addendum Note (Signed)
Addended by: Sheliah Hatch on: 12/15/2021 07:17 AM ? ? Modules accepted: Orders ? ?

## 2022-01-23 IMAGING — CT CT HEAD W/O CM
3 of 4 series · 14 of 47 positions shown, 16 images · non-contrast
Comparison: None.
COMPARISON: None.

Addendum:
CLINICAL DATA: Neuro deficit, altered gait

EXAM:
CT HEAD WITHOUT CONTRAST
TECHNIQUE: Contiguous axial images were obtained from the base of the skull
through the vertex without intravenous contrast.

[Series 3: head without · axial · non-contrast · 0.47mm/px · z∈[+1253,+1373]mm · 7 of 33 slices shown, 9 images]
[im 5/33  brain]
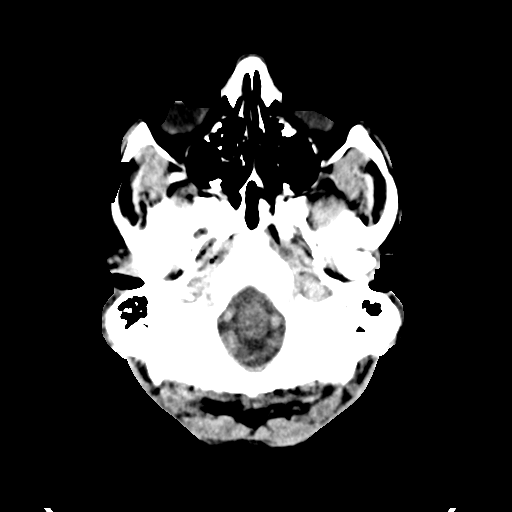
[im 5/33  bone]
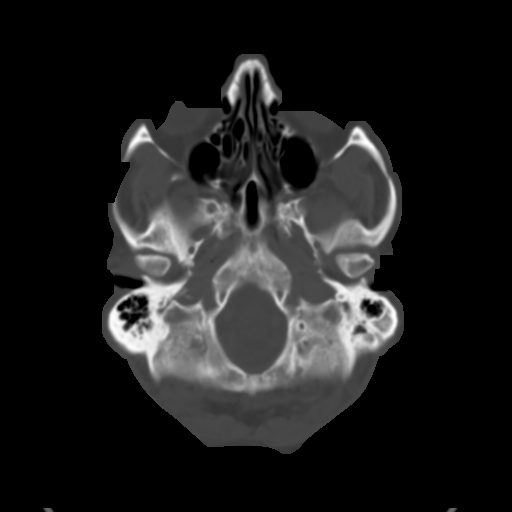
[im 9/33  brain]
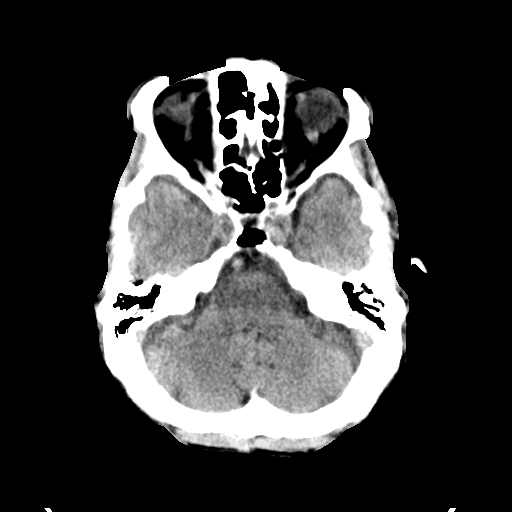
[im 13/33  brain]
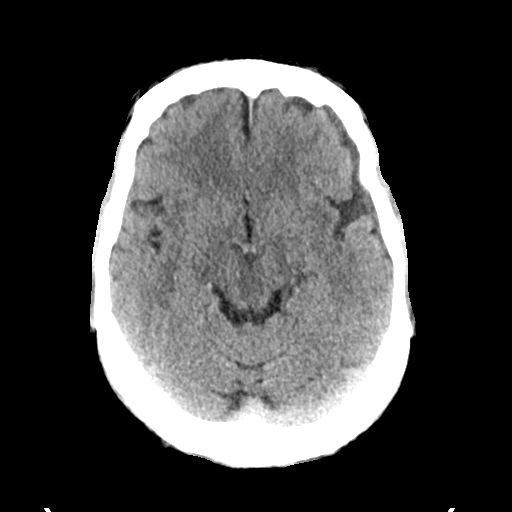
[im 17/33  brain]
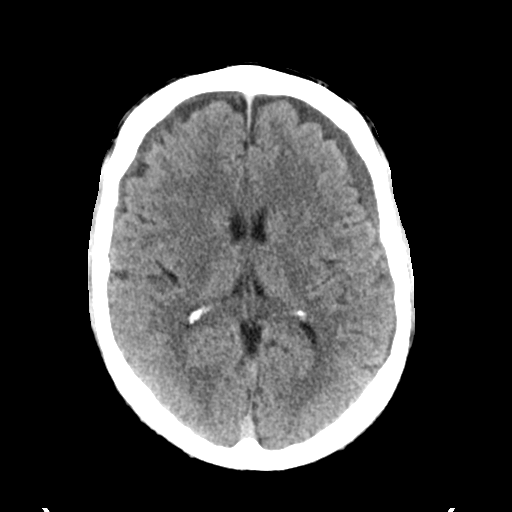
[im 21/33  brain]
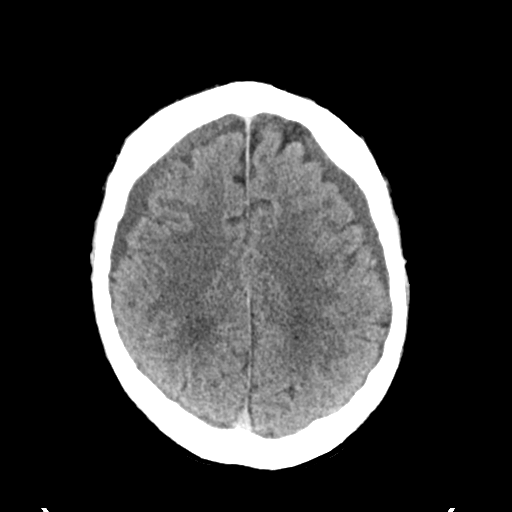
[im 21/33  bone]
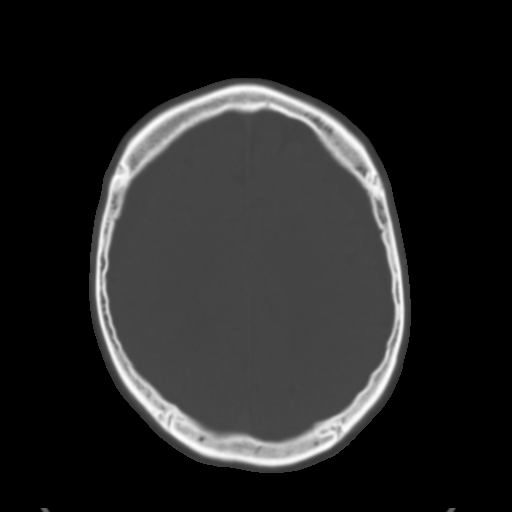
[im 25/33  brain]
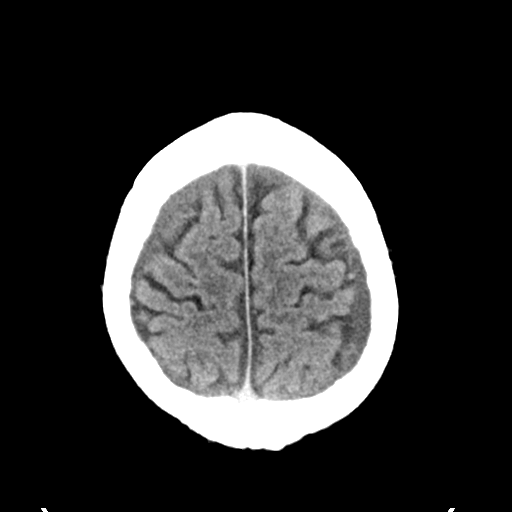
[im 29/33  brain]
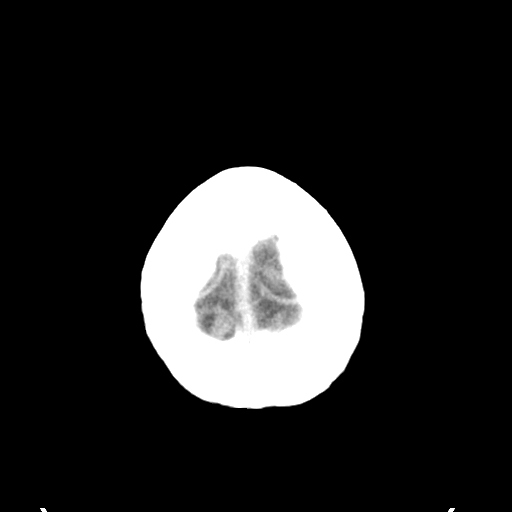

[Series 4: head bone · axial · 0.47mm/px · z∈[+1249,+1305]mm · 4 of 82 slices shown]
[im 9/82  bone]
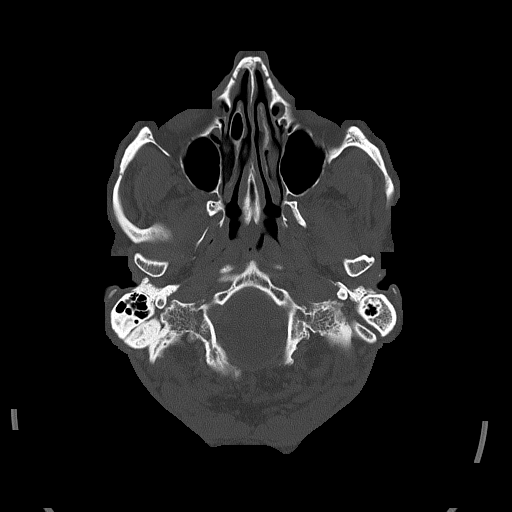
[im 17/82  bone]
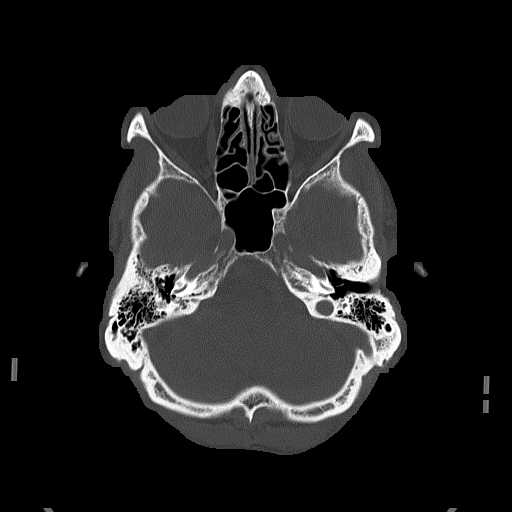
[im 25/82  bone]
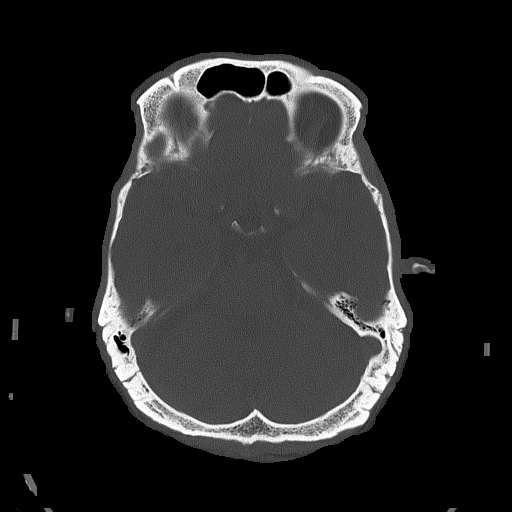
[im 37/82  bone]
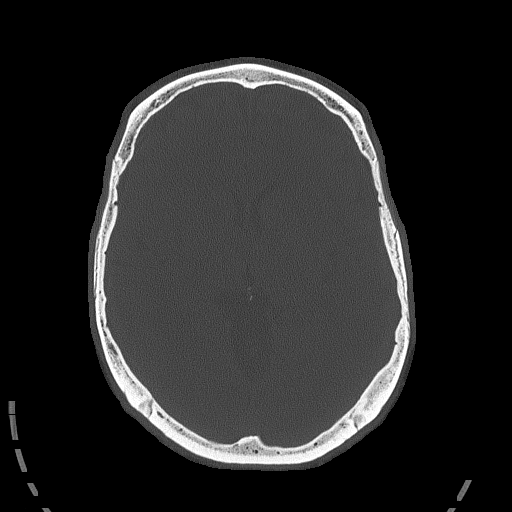

[Series 5: head without cor · coronal · non-contrast · 0.32mm/px · 3 of 74 slices shown]
[im 25/74  brain]
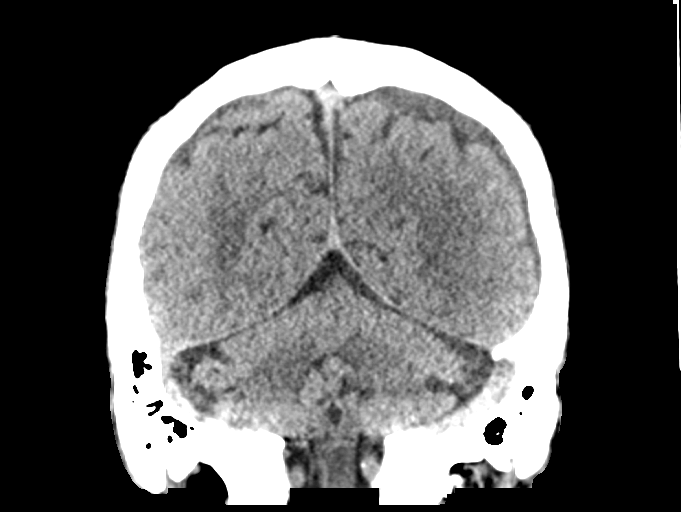
[im 33/74  brain]
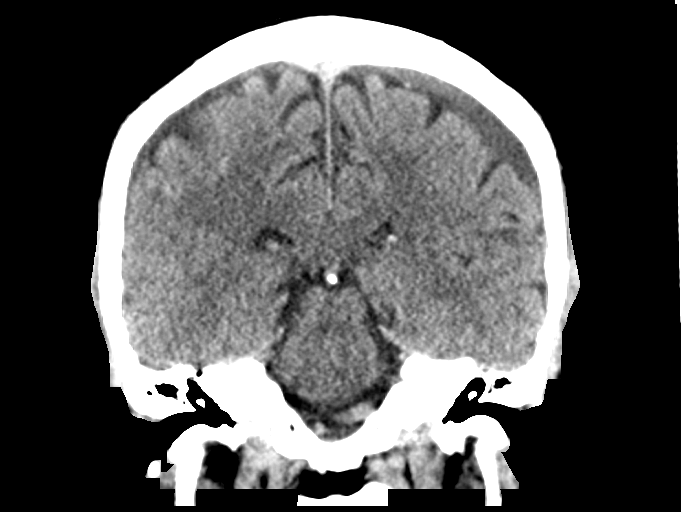
[im 41/74  brain]
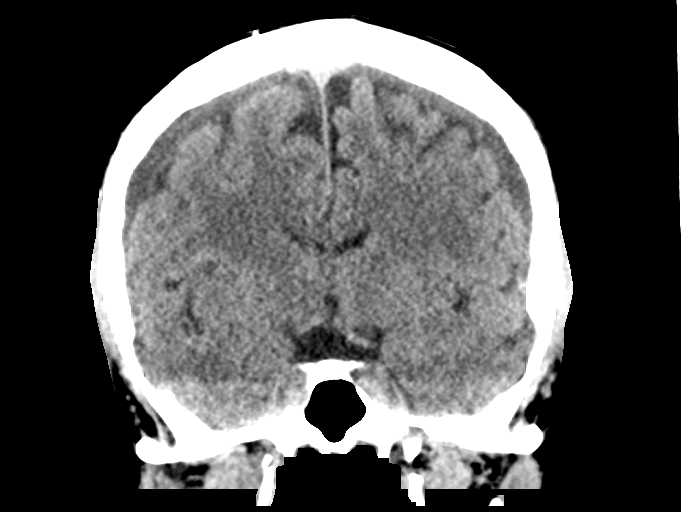

[14 of 47 positions shown; findings below may reference images not displayed]

FINDINGS: Brain: There are layering crescentic extra-axial collections over
the bilateral frontoparietal convexities measuring up to 8 mm on the
left and 5 mm on the right. Additional mixed attenuation collection
across the left cerebellar convexity as well measuring up to 6 mm in
size. No acute intraparenchymal hemorrhage. No CT evidence of
cortically based or large territory infarct is seen. No evidence of
hydrocephaly. Basal cisterns are patent. No mass effect or midline
shift. Findings on a background of diffuse parenchymal volume loss
and chronic microvascular angiopathy.

Vascular: Atherosclerotic calcification of the carotid siphons and
intradural vertebral arteries. No hyperdense vessel.

Skull: No calvarial fracture or suspicious osseous lesion. No scalp
swelling or hematoma.

Sinuses/Orbits: Paranasal sinuses and mastoid air cells are
predominantly clear. Rightward nasal septal deviation with a
contacting right-sided nasal septal spur. Included orbital
structures are unremarkable.

Other: None.
IMPRESSION: 1. Small layering attenuation crescentic extra-axial collections
over the bilateral frontoparietal convexities measuring up to 8 mm
on the left and 5 mm on the right. Similar collection across the
left cerebellar convexity up to 6 mm in thickness. Findings are
favored to represent subacute to chronic subdural hematomas. No
significant resulting mass effect or midline shift.
2. Background of diffuse parenchymal volume loss and chronic
microvascular angiopathy.
3. Intracranial atherosclerosis.
4. Rightward nasal septal deviation with a contacting right-sided
nasal septal spur.

Currently attempting to contact the ordering provider, addendum will
be submitted upon case discussion.

ADDENDUM:
Critical Value/emergent results were called by telephone at the time
of interpretation on 03/14/2020 at [DATE] to provider EDMAR HELM
, who verbally acknowledged these results.

*** End of Addendum ***
FINDINGS: Brain: There are layering crescentic extra-axial collections over
the bilateral frontoparietal convexities measuring up to 8 mm on the
left and 5 mm on the right. Additional mixed attenuation collection
across the left cerebellar convexity as well measuring up to 6 mm in
size. No acute intraparenchymal hemorrhage. No CT evidence of
cortically based or large territory infarct is seen. No evidence of
hydrocephaly. Basal cisterns are patent. No mass effect or midline
shift. Findings on a background of diffuse parenchymal volume loss
and chronic microvascular angiopathy.

Vascular: Atherosclerotic calcification of the carotid siphons and
intradural vertebral arteries. No hyperdense vessel.

Skull: No calvarial fracture or suspicious osseous lesion. No scalp
swelling or hematoma.

Sinuses/Orbits: Paranasal sinuses and mastoid air cells are
predominantly clear. Rightward nasal septal deviation with a
contacting right-sided nasal septal spur. Included orbital
structures are unremarkable.

Other: None.
IMPRESSION: 1. Small layering attenuation crescentic extra-axial collections
over the bilateral frontoparietal convexities measuring up to 8 mm
on the left and 5 mm on the right. Similar collection across the
left cerebellar convexity up to 6 mm in thickness. Findings are
favored to represent subacute to chronic subdural hematomas. No
significant resulting mass effect or midline shift.
2. Background of diffuse parenchymal volume loss and chronic
microvascular angiopathy.
3. Intracranial atherosclerosis.
4. Rightward nasal septal deviation with a contacting right-sided
nasal septal spur.

Currently attempting to contact the ordering provider, addendum will
be submitted upon case discussion.

## 2022-03-16 ENCOUNTER — Ambulatory Visit (INDEPENDENT_AMBULATORY_CARE_PROVIDER_SITE_OTHER): Payer: Medicare HMO

## 2022-03-16 DIAGNOSIS — Z0000 Encounter for general adult medical examination without abnormal findings: Secondary | ICD-10-CM | POA: Diagnosis not present

## 2022-03-16 NOTE — Patient Instructions (Signed)
Jack Randolph , Thank you for taking time to come for your Medicare Wellness Visit. I appreciate your ongoing commitment to your health goals. Please review the following plan we discussed and let me know if I can assist you in the future.   Screening recommendations/referrals: Colonoscopy: no longer required  Recommended yearly ophthalmology/optometry visit for glaucoma screening and checkup Recommended yearly dental visit for hygiene and checkup  Vaccinations: Influenza vaccine: completed  Pneumococcal vaccine: completed  Tdap vaccine: 12/21/2021 Shingles vaccine: completed     Advanced directives: yes   Conditions/risks identified: none   Next appointment: none   Preventive Care 65 Years and Older, Male Preventive care refers to lifestyle choices and visits with your health care provider that can promote health and wellness. What does preventive care include? A yearly physical exam. This is also called an annual well check. Dental exams once or twice a year. Routine eye exams. Ask your health care provider how often you should have your eyes checked. Personal lifestyle choices, including: Daily care of your teeth and gums. Regular physical activity. Eating a healthy diet. Avoiding tobacco and drug use. Limiting alcohol use. Practicing safe sex. Taking low doses of aspirin every day. Taking vitamin and mineral supplements as recommended by your health care provider. What happens during an annual well check? The services and screenings done by your health care provider during your annual well check will depend on your age, overall health, lifestyle risk factors, and family history of disease. Counseling  Your health care provider may ask you questions about your: Alcohol use. Tobacco use. Drug use. Emotional well-being. Home and relationship well-being. Sexual activity. Eating habits. History of falls. Memory and ability to understand (cognition). Work and work  Astronomer. Screening  You may have the following tests or measurements: Height, weight, and BMI. Blood pressure. Lipid and cholesterol levels. These may be checked every 5 years, or more frequently if you are over 45 years old. Skin check. Lung cancer screening. You may have this screening every year starting at age 86 if you have a 30-pack-year history of smoking and currently smoke or have quit within the past 15 years. Fecal occult blood test (FOBT) of the stool. You may have this test every year starting at age 86. Flexible sigmoidoscopy or colonoscopy. You may have a sigmoidoscopy every 5 years or a colonoscopy every 10 years starting at age 86. Prostate cancer screening. Recommendations will vary depending on your family history and other risks. Hepatitis C blood test. Hepatitis B blood test. Sexually transmitted disease (STD) testing. Diabetes screening. This is done by checking your blood sugar (glucose) after you have not eaten for a while (fasting). You may have this done every 1-3 years. Abdominal aortic aneurysm (AAA) screening. You may need this if you are a current or former smoker. Osteoporosis. You may be screened starting at age 86 if you are at high risk. Talk with your health care provider about your test results, treatment options, and if necessary, the need for more tests. Vaccines  Your health care provider may recommend certain vaccines, such as: Influenza vaccine. This is recommended every year. Tetanus, diphtheria, and acellular pertussis (Tdap, Td) vaccine. You may need a Td booster every 10 years. Zoster vaccine. You may need this after age 86. Pneumococcal 13-valent conjugate (PCV13) vaccine. One dose is recommended after age 86. Pneumococcal polysaccharide (PPSV23) vaccine. One dose is recommended after age 86. Talk to your health care provider about which screenings and vaccines you need and how  often you need them. This information is not intended to replace  advice given to you by your health care provider. Make sure you discuss any questions you have with your health care provider. Document Released: 09/04/2015 Document Revised: 04/27/2016 Document Reviewed: 06/09/2015 Elsevier Interactive Patient Education  2017 [place] Prevention in the Home Falls can cause injuries. They can happen to people of all ages. There are many things you can do to make your home safe and to help prevent falls. What can I do on the outside of my home? Regularly fix the edges of walkways and driveways and fix any cracks. Remove anything that might make you trip as you walk through a door, such as a raised step or threshold. Trim any bushes or trees on the path to your home. Use bright outdoor lighting. Clear any walking paths of anything that might make someone trip, such as rocks or tools. Regularly check to see if handrails are loose or broken. Make sure that both sides of any steps have handrails. Any raised decks and porches should have guardrails on the edges. Have any leaves, snow, or ice cleared regularly. Use sand or salt on walking paths during winter. Clean up any spills in your garage right away. This includes oil or grease spills. What can I do in the bathroom? Use night lights. Install grab bars by the toilet and in the tub and shower. Do not use towel bars as grab bars. Use non-skid mats or decals in the tub or shower. If you need to sit down in the shower, use a plastic, non-slip stool. Keep the floor dry. Clean up any water that spills on the floor as soon as it happens. Remove soap buildup in the tub or shower regularly. Attach bath mats securely with double-sided non-slip rug tape. Do not have throw rugs and other things on the floor that can make you trip. What can I do in the bedroom? Use night lights. Make sure that you have a light by your bed that is easy to reach. Do not use any sheets or blankets that are too big for your bed.  They should not hang down onto the floor. Have a firm chair that has side arms. You can use this for support while you get dressed. Do not have throw rugs and other things on the floor that can make you trip. What can I do in the kitchen? Clean up any spills right away. Avoid walking on wet floors. Keep items that you use a lot in easy-to-reach places. If you need to reach something above you, use a strong step stool that has a grab bar. Keep electrical cords out of the way. Do not use floor polish or wax that makes floors slippery. If you must use wax, use non-skid floor wax. Do not have throw rugs and other things on the floor that can make you trip. What can I do with my stairs? Do not leave any items on the stairs. Make sure that there are handrails on both sides of the stairs and use them. Fix handrails that are broken or loose. Make sure that handrails are as long as the stairways. Check any carpeting to make sure that it is firmly attached to the stairs. Fix any carpet that is loose or worn. Avoid having throw rugs at the top or bottom of the stairs. If you do have throw rugs, attach them to the floor with carpet tape. Make sure that you have a  light switch at the top of the stairs and the bottom of the stairs. If you do not have them, ask someone to add them for you. What else can I do to help prevent falls? Wear shoes that: Do not have high heels. Have rubber bottoms. Are comfortable and fit you well. Are closed at the toe. Do not wear sandals. If you use a stepladder: Make sure that it is fully opened. Do not climb a closed stepladder. Make sure that both sides of the stepladder are locked into place. Ask someone to hold it for you, if possible. Clearly mark and make sure that you can see: Any grab bars or handrails. First and last steps. Where the edge of each step is. Use tools that help you move around (mobility aids) if they are needed. These  include: Canes. Walkers. Scooters. Crutches. Turn on the lights when you go into a dark area. Replace any light bulbs as soon as they burn out. Set up your furniture so you have a clear path. Avoid moving your furniture around. If any of your floors are uneven, fix them. If there are any pets around you, be aware of where they are. Review your medicines with your doctor. Some medicines can make you feel dizzy. This can increase your chance of falling. Ask your doctor what other things that you can do to help prevent falls. This information is not intended to replace advice given to you by your health care provider. Make sure you discuss any questions you have with your health care provider. Document Released: 06/04/2009 Document Revised: 01/14/2016 Document Reviewed: 09/12/2014 Elsevier Interactive Patient Education  2017 [employer].

## 2022-03-16 NOTE — Progress Notes (Signed)
Subjective:   Jack Randolph is a 88 y.o. male who presents for an Subsequent Medicare Annual Wellness Visit.   I connected with Meredeth Ide  today by telephone and verified that I am speaking with the correct person using two identifiers. Location patient: home Location provider: work Persons participating in the virtual visit: patient, provider.   I discussed the limitations, risks, security and privacy concerns of performing an evaluation and management service by telephone and the availability of in person appointments. I also discussed with the patient that there may be a patient responsible charge related to this service. The patient expressed understanding and verbally consented to this telephonic visit.    Interactive audio and video telecommunications were attempted between this provider and patient, however failed, due to patient having technical difficulties OR patient did not have access to video capability.  We continued and completed visit with audio only.    Review of Systems     Cardiac Risk Factors include: advanced age (>61men, >9 women)     Objective:    Today's Vitals   There is no height or weight on file to calculate BMI.     03/16/2022    8:25 AM 06/14/2017    8:46 AM  Advanced Directives  Does Patient Have a Medical Advance Directive? Yes Yes  Type of Advance Directive Living will Living will;Healthcare Power of Attorney  Copy of Healthcare Power of Attorney in Chart?  No - copy requested    Current Medications (verified) Outpatient Encounter Medications as of 03/16/2022  Medication Sig   Ascorbic Acid (VITAMIN C) 250 MG CHEW Chew by mouth.   bisacodyl (DULCOLAX) 5 MG EC tablet Take 5 mg by mouth daily as needed for moderate constipation.   Calcium Carbonate-Vitamin D (CALCIUM 500 + D PO) Take by mouth.   Coenzyme Q10 (COQ10) 100 MG CAPS Take by mouth.   cyanocobalamin 100 MCG tablet Take 100 mcg by mouth daily.   loratadine-pseudoephedrine  (CLARITIN-D 24-HOUR) 10-240 MG 24 hr tablet Take 1 tablet by mouth daily.   Magnesium 400 MG TABS Take by mouth.   Multiple Vitamins-Minerals (CENTRUM MULTI + OMEGA 3 PO) Take by mouth.   Multiple Vitamins-Minerals (MULTIVITAMIN ADULT) CHEW Chew by mouth.   omeprazole (PRILOSEC) 20 MG capsule Take 1 capsule (20 mg total) by mouth daily.   ramipril (ALTACE) 10 MG capsule Take 1 capsule by mouth once daily   tamsulosin (FLOMAX) 0.4 MG CAPS capsule Take 1 capsule (0.4 mg total) by mouth every 12 (twelve) hours.   atorvastatin (LIPITOR) 20 MG tablet Take 1 tablet (20 mg total) by mouth daily. (Patient not taking: Reported on 03/16/2022)   No facility-administered encounter medications on file as of 03/16/2022.    Allergies (verified) Patient has no known allergies.   History: Past Medical History:  Diagnosis Date   Hypertension    History reviewed. No pertinent surgical history. Family History  Problem Relation Age of Onset   Healthy Mother    COPD Father    Social History   Socioeconomic History   Marital status: Married    Spouse name: Not on file   Number of children: 5   Years of education: Not on file   Highest education level: Not on file  Occupational History   Occupation: Holiday representative  Tobacco Use   Smoking status: Never   Smokeless tobacco: Never  Vaping Use   Vaping Use: Never used  Substance and Sexual Activity   Alcohol use: Yes  Drug use: No   Sexual activity: Not on file  Other Topics Concern   Not on file  Social History Narrative   Not on file   Social Determinants of Health   Financial Resource Strain: Low Risk  (03/16/2022)   Overall Financial Resource Strain (CARDIA)    Difficulty of Paying Living Expenses: Not hard at all  Food Insecurity: No Food Insecurity (03/16/2022)   Hunger Vital Sign    Worried About Running Out of Food in the Last Year: Never true    Ran Out of Food in the Last Year: Never true  Transportation Needs: No Transportation  Needs (03/16/2022)   PRAPARE - Administrator, Civil Service (Medical): No    Lack of Transportation (Non-Medical): No  Physical Activity: Sufficiently Active (03/16/2022)   Exercise Vital Sign    Days of Exercise per Week: 5 days    Minutes of Exercise per Session: 30 min  Stress: No Stress Concern Present (03/16/2022)   [employer] of Occupational Health - Occupational Stress Questionnaire    Feeling of Stress : Not at all  Social Connections: Socially Integrated (03/16/2022)   Social Connection and Isolation Panel [NHANES]    Frequency of Communication with Friends and Family: Three times a week    Frequency of Social Gatherings with Friends and Family: Three times a week    Attends Religious Services: More than 4 times per year    Active Member of Clubs or Organizations: Yes    Attends Banker Meetings: 1 to 4 times per year    Marital Status: Married    Tobacco Counseling Counseling given: Not Answered   Clinical Intake:  Pre-visit preparation completed: Yes  Pain : No/denies pain     Nutritional Risks: None Diabetes: No  How often do you need to have someone help you when you read instructions, pamphlets, or other written materials from your doctor or pharmacy?: 1 - Never What is the last grade level you completed in school?: college  Diabetic?no   Interpreter Needed?: No  Information entered by :: L.Wilson,LPN   Activities of Daily Living    03/16/2022    8:28 AM 12/13/2021    9:07 AM  In your present state of health, do you have any difficulty performing the following activities:  Hearing? 0 0  Vision? 0 0  Difficulty concentrating or making decisions? 0 0  Walking or climbing stairs? 0 0  Dressing or bathing? 0 0  Doing errands, shopping? 0 0  Preparing Food and eating ? N   Using the Toilet? N   In the past six months, have you accidently leaked urine? N   Do you have problems with loss of bowel control? N   Managing your  Medications? N   Managing your Finances? N   Housekeeping or managing your Housekeeping? N     Patient Care Team: Sheliah Hatch, MD as PCP - General (Family Medicine) Rene Paci, MD as Consulting Physician (Urology)  Indicate any recent Medical Services you may have received from other than [facility] providers in the past year (date may be approximate).     Assessment:   This is a routine wellness examination for Jospeh.  Hearing/Vision screen Vision Screening - Comments:: Annual eye exams wears glasses   Dietary issues and exercise activities discussed: Current Exercise Habits: Home exercise routine, Type of exercise: walking, Time (Minutes): 30, Frequency (Times/Week): 5, Weekly Exercise (Minutes/Week): 150, Intensity: Mild, Exercise limited by: None identified  Goals Addressed   None    Depression Screen    03/16/2022    8:26 AM 03/16/2022    8:23 AM 06/21/2021    8:43 AM 09/21/2020    8:05 AM 03/20/2020   11:09 AM 02/01/2019   11:22 AM 06/14/2017    8:47 AM  PHQ 2/9 Scores  PHQ - 2 Score 0 0 0 0 0 0 0  PHQ- 9 Score   0 0  0     Fall Risk    03/16/2022    8:26 AM 06/21/2021    8:43 AM 09/21/2020    8:05 AM 03/20/2020   11:09 AM 02/01/2019   11:22 AM  Fall Risk   Falls in the past year? 0 0 0 0 0  Number falls in past yr: 0  0 0 0  Injury with Fall? 0  0 0 0  Risk for fall due to :  No Fall Risks No Fall Risks    Follow up Falls evaluation completed;Education provided Falls evaluation completed  Falls evaluation completed     FALL RISK PREVENTION PERTAINING TO THE HOME:  Any stairs in or around the home? Yes  If so, are there any without handrails? No  Home free of loose throw rugs in walkways, pet beds, electrical cords, etc? No  Adequate lighting in your home to reduce risk of falls? No   ASSISTIVE DEVICES UTILIZED TO PREVENT FALLS:  Life alert? No  Use of a cane, walker or w/c? No  Grab bars in the bathroom? No  Shower chair or bench in  shower? No  Elevated toilet seat or a handicapped toilet? No     Cognitive Function:  Normal cognitive status assessed by telephone conversation  by this Nurse Health Advisor. No abnormalities found.      06/14/2017    8:48 AM  MMSE - Mini Mental State Exam  Orientation to time 5  Orientation to Place 5  Registration 3  Attention/ Calculation 3  Recall 3  Language- name 2 objects 2  Language- repeat 1  Language- follow 3 step command 3  Language- read & follow direction 1  Write a sentence 1  Copy design 1  Total score 28        Immunizations Immunization History  Administered Date(s) Administered   Influenza, High Dose Seasonal PF 04/22/2019, 04/21/2020   Influenza,inj,Quad PF,6+ Mos 04/11/2017, 04/11/2017   Influenza-Unspecified 10/05/2010, 04/25/2018, 05/04/2021   PFIZER(Purple Top)SARS-COV-2 Vaccination 09/13/2019, 10/04/2019, 06/08/2020, 02/27/2021   Pfizer Covid-19 Vaccine Bivalent Booster 67yrs & up 05/25/2021   Pneumococcal Conjugate-13 04/17/2015   Pneumococcal Polysaccharide-23 03/22/2016   Pneumococcal-Unspecified 10/05/2010   Tdap 12/21/2021   Zoster Recombinat (Shingrix) 12/10/2021    TDAP status: Up to date  Flu Vaccine status: Up to date  Pneumococcal vaccine status: Up to date  Covid-19 vaccine status: Completed vaccines  Qualifies for Shingles Vaccine? Yes   Zostavax completed Yes   Shingrix Completed?: Yes  Screening Tests Health Maintenance  Topic Date Due   COVID-19 Vaccine (6 - Pfizer series) 09/25/2021   Zoster Vaccines- Shingrix (2 of 2) 02/04/2022   INFLUENZA VACCINE  03/22/2022   TETANUS/TDAP  12/22/2031   Pneumonia Vaccine 76+ Years old  Completed   HPV VACCINES  Aged Out    Health Maintenance  Health Maintenance Due  Topic Date Due   COVID-19 Vaccine (6 - Pfizer series) 09/25/2021   Zoster Vaccines- Shingrix (2 of 2) 02/04/2022    Colorectal cancer screening: No longer required.  Lung Cancer Screening: (Low Dose  CT Chest recommended if Age 54-80 years, 30 pack-year currently smoking OR have quit w/in 15years.) does not qualify.   Lung Cancer Screening Referral: n/a  Additional Screening:  Hepatitis C Screening: does not qualify;   Vision Screening: Recommended annual ophthalmology exams for early detection of glaucoma and other disorders of the eye. Is the patient up to date with their annual eye exam?  Yes  Who is the provider or what is the name of the office in which the patient attends annual eye exams? [facility]  If pt is not established with a provider, would they like to be referred to a provider to establish care? No .   Dental Screening: Recommended annual dental exams for proper oral hygiene  Community Resource Referral / Chronic Care Management: CRR required this visit?  No   CCM required this visit?  No      Plan:     I have personally reviewed and noted the following in the patient's chart:   Medical and social history Use of alcohol, tobacco or illicit drugs  Current medications and supplements including opioid prescriptions. Patient is not currently taking opioid prescriptions. Functional ability and status Nutritional status Physical activity Advanced directives List of other physicians Hospitalizations, surgeries, and ER visits in previous 12 months Vitals Screenings to include cognitive, depression, and falls Referrals and appointments  In addition, I have reviewed and discussed with patient certain preventive protocols, quality metrics, and best practice recommendations. A written personalized care plan for preventive services as well as general preventive health recommendations were provided to patient.     Lorrene Reid, LPN   1/94/1740   Nurse Notes: none

## 2022-05-05 DIAGNOSIS — H52223 Regular astigmatism, bilateral: Secondary | ICD-10-CM | POA: Diagnosis not present

## 2022-05-17 ENCOUNTER — Other Ambulatory Visit: Payer: Self-pay

## 2022-05-17 MED ORDER — RAMIPRIL 10 MG PO CAPS: 10.0000 mg | ORAL_CAPSULE | Freq: Every day | ORAL | 1 refills | Status: DC

## 2022-06-13 ENCOUNTER — Other Ambulatory Visit: Payer: Self-pay

## 2022-06-13 ENCOUNTER — Encounter: Payer: Self-pay | Admitting: Family Medicine

## 2022-06-13 ENCOUNTER — Ambulatory Visit (INDEPENDENT_AMBULATORY_CARE_PROVIDER_SITE_OTHER): Payer: Medicare HMO | Admitting: Family Medicine

## 2022-06-13 VITALS — BP 130/82 | HR 94 | Temp 97.8°F | Resp 16 | Ht 71.0 in | Wt 198.5 lb

## 2022-06-13 DIAGNOSIS — E785 Hyperlipidemia, unspecified: Secondary | ICD-10-CM

## 2022-06-13 DIAGNOSIS — I10 Essential (primary) hypertension: Secondary | ICD-10-CM

## 2022-06-13 DIAGNOSIS — E663 Overweight: Secondary | ICD-10-CM | POA: Diagnosis not present

## 2022-06-13 LAB — CBC WITH DIFFERENTIAL/PLATELET
Basophils Absolute: 0.1 10*3/uL (ref 0.0–0.1)
Basophils Relative: 0.9 % (ref 0.0–3.0)
Eosinophils Absolute: 0.2 10*3/uL (ref 0.0–0.7)
Eosinophils Relative: 3.8 % (ref 0.0–5.0)
HCT: 42.2 % (ref 39.0–52.0)
Hemoglobin: 14.1 g/dL (ref 13.0–17.0)
Lymphocytes Relative: 34 % (ref 12.0–46.0)
Lymphs Abs: 1.9 10*3/uL (ref 0.7–4.0)
MCHC: 33.5 g/dL (ref 30.0–36.0)
MCV: 88.7 fl (ref 78.0–100.0)
Monocytes Absolute: 0.5 10*3/uL (ref 0.1–1.0)
Monocytes Relative: 9.1 % (ref 3.0–12.0)
Neutro Abs: 2.9 10*3/uL (ref 1.4–7.7)
Neutrophils Relative %: 52.2 % (ref 43.0–77.0)
Platelets: 210 10*3/uL (ref 150.0–400.0)
RBC: 4.76 Mil/uL (ref 4.22–5.81)
RDW: 14.4 % (ref 11.5–15.5)
WBC: 5.5 10*3/uL (ref 4.0–10.5)

## 2022-06-13 LAB — LIPID PANEL
Cholesterol: 230 mg/dL — ABNORMAL HIGH (ref 0–200)
HDL: 41.1 mg/dL (ref >39.00)
LDL Cholesterol: 158 mg/dL — ABNORMAL HIGH (ref 0–99)
NonHDL: 188.71
Total CHOL/HDL Ratio: 6
Triglycerides: 154 mg/dL — ABNORMAL HIGH (ref 0.0–149.0)
VLDL: 30.8 mg/dL (ref 0.0–40.0)

## 2022-06-13 LAB — HEPATIC FUNCTION PANEL
ALT: 17 U/L (ref 0–53)
AST: 18 U/L (ref 0–37)
Albumin: 4.2 g/dL (ref 3.5–5.2)
Alkaline Phosphatase: 68 U/L (ref 39–117)
Bilirubin, Direct: 0.1 mg/dL (ref 0.0–0.3)
Total Bilirubin: 0.5 mg/dL (ref 0.2–1.2)
Total Protein: 7.7 g/dL (ref 6.0–8.3)

## 2022-06-13 LAB — TSH: TSH: 1.96 u[IU]/mL (ref 0.35–5.50)

## 2022-06-13 LAB — BASIC METABOLIC PANEL
BUN: 26 mg/dL — ABNORMAL HIGH (ref 6–23)
CO2: 31 mEq/L (ref 19–32)
Calcium: 9.3 mg/dL (ref 8.4–10.5)
Chloride: 103 mEq/L (ref 96–112)
Creatinine, Ser: 1.03 mg/dL (ref 0.40–1.50)
GFR: 65.6 mL/min (ref >60.00)
Glucose, Bld: 70 mg/dL (ref 70–99)
Potassium: 4.4 mEq/L (ref 3.5–5.1)
Sodium: 139 mEq/L (ref 135–145)

## 2022-06-13 MED ORDER — EZETIMIBE 10 MG PO TABS: 10.0000 mg | ORAL_TABLET | Freq: Every day | ORAL | 3 refills | Status: DC

## 2022-06-13 NOTE — Assessment & Plan Note (Signed)
Chronic problem.  Weight is stable and BMI 27.69.  Encouraged healthy diet and regular physical activity.  Will follow.

## 2022-06-13 NOTE — Patient Instructions (Addendum)
Schedule your complete physical in 6 months We'll notify you of your lab results and make any changes if needed Keep up the good work on healthy diet and regular exercise- you're doing great!!! Call with any questions or concerns Stay Safe!  Stay Healthy! Happy Fall!!! 

## 2022-06-13 NOTE — Progress Notes (Signed)
   Subjective:    Patient ID: Jack Randolph, male    DOB: Jul 04, 1933, 88 y.o.   MRN: [id number]  HPI HTN- chronic problem, on Ramipril 10mg  daily w/ adequate control.  No CP, SOB, HAs, visual changes, edema.  Overweight- ongoing issue.  Weight is stable.  BMI 27.69  Hyperlipidemia- pt was prescribed Atorvastatin 20mg  daily but pt is not taking.  Pt reports he took the medication x2 days and 'i had to quit'.  Last LDL 157  No abd pain, N/V.  UTD on flu- 05/06/22, shingles 12/10/21, 02/15/22   Review of Systems For ROS see HPI     Objective:   Physical Exam Vitals reviewed.  Constitutional:      General: He is not in acute distress.    Appearance: Normal appearance. He is well-developed. He is not ill-appearing.  HENT:     Head: Normocephalic and atraumatic.  Eyes:     Extraocular Movements: Extraocular movements intact.     Conjunctiva/sclera: Conjunctivae normal.     Pupils: Pupils are equal, round, and reactive to light.  Neck:     Thyroid: No thyromegaly.  Cardiovascular:     Rate and Rhythm: Normal rate and regular rhythm.     Pulses: Normal pulses.     Heart sounds: Normal heart sounds. No murmur heard. Pulmonary:     Effort: Pulmonary effort is normal. No respiratory distress.     Breath sounds: Normal breath sounds.  Abdominal:     General: Bowel sounds are normal. There is no distension.     Palpations: Abdomen is soft.  Musculoskeletal:     Cervical back: Normal range of motion and neck supple.     Right lower leg: No edema.     Left lower leg: No edema.  Lymphadenopathy:     Cervical: No cervical adenopathy.  Skin:    General: Skin is warm and dry.  Neurological:     General: No focal deficit present.     Mental Status: He is alert and oriented to person, place, and time.     Cranial Nerves: No cranial nerve deficit.  Psychiatric:        Mood and Affect: Mood normal.        Behavior: Behavior normal.           Assessment & Plan:

## 2022-06-13 NOTE — Progress Notes (Signed)
Informed pt of lab results . Sent in Zetia 10 mg #90 to the pharmacy

## 2022-06-13 NOTE — Assessment & Plan Note (Signed)
Chronic problem.  Was prescribed Lipitor but pt reports he felt terrible while taking it.  Has been intolerant to other statins in the past.  Check labs.  If LDL is still elevated will start Zetia as pt is open to this idea.

## 2022-06-13 NOTE — Assessment & Plan Note (Signed)
Chronic problem.  On Ramipril 10mg  daily w/ adequate control.  Currently asymptomatic.  Check labs due to ACE but no anticipated med changes

## 2022-07-19 DIAGNOSIS — H2513 Age-related nuclear cataract, bilateral: Secondary | ICD-10-CM | POA: Diagnosis not present

## 2022-07-19 DIAGNOSIS — H2512 Age-related nuclear cataract, left eye: Secondary | ICD-10-CM | POA: Diagnosis not present

## 2022-07-19 DIAGNOSIS — H18413 Arcus senilis, bilateral: Secondary | ICD-10-CM | POA: Diagnosis not present

## 2022-07-19 DIAGNOSIS — H35371 Puckering of macula, right eye: Secondary | ICD-10-CM | POA: Diagnosis not present

## 2022-07-19 DIAGNOSIS — H25013 Cortical age-related cataract, bilateral: Secondary | ICD-10-CM | POA: Diagnosis not present

## 2022-07-19 DIAGNOSIS — H25043 Posterior subcapsular polar age-related cataract, bilateral: Secondary | ICD-10-CM | POA: Diagnosis not present

## 2022-11-14 ENCOUNTER — Other Ambulatory Visit: Payer: Self-pay | Admitting: Family Medicine

## 2022-12-26 ENCOUNTER — Encounter: Payer: Self-pay | Admitting: Family Medicine

## 2022-12-26 ENCOUNTER — Ambulatory Visit (INDEPENDENT_AMBULATORY_CARE_PROVIDER_SITE_OTHER): Payer: Medicare HMO | Admitting: Family Medicine

## 2022-12-26 VITALS — BP 122/80 | HR 70 | Temp 98.2°F | Resp 19 | Ht 71.0 in | Wt 197.2 lb

## 2022-12-26 DIAGNOSIS — I10 Essential (primary) hypertension: Secondary | ICD-10-CM | POA: Diagnosis not present

## 2022-12-26 DIAGNOSIS — Z0000 Encounter for general adult medical examination without abnormal findings: Secondary | ICD-10-CM

## 2022-12-26 LAB — LIPID PANEL
Cholesterol: 206 mg/dL — ABNORMAL HIGH (ref 0–200)
HDL: 35.7 mg/dL — ABNORMAL LOW (ref >39.00)
LDL Cholesterol: 140 mg/dL — ABNORMAL HIGH (ref 0–99)
NonHDL: 170.14
Total CHOL/HDL Ratio: 6
Triglycerides: 153 mg/dL — ABNORMAL HIGH (ref 0.0–149.0)
VLDL: 30.6 mg/dL (ref 0.0–40.0)

## 2022-12-26 LAB — BASIC METABOLIC PANEL
BUN: 33 mg/dL — ABNORMAL HIGH (ref 6–23)
CO2: 27 mEq/L (ref 19–32)
Calcium: 9 mg/dL (ref 8.4–10.5)
Chloride: 103 mEq/L (ref 96–112)
Creatinine, Ser: 1.07 mg/dL (ref 0.40–1.50)
GFR: 62.44 mL/min (ref >60.00)
Glucose, Bld: 77 mg/dL (ref 70–99)
Potassium: 4.1 mEq/L (ref 3.5–5.1)
Sodium: 138 mEq/L (ref 135–145)

## 2022-12-26 LAB — HEPATIC FUNCTION PANEL
ALT: 21 U/L (ref 0–53)
AST: 20 U/L (ref 0–37)
Albumin: 4 g/dL (ref 3.5–5.2)
Alkaline Phosphatase: 66 U/L (ref 39–117)
Bilirubin, Direct: 0.1 mg/dL (ref 0.0–0.3)
Total Bilirubin: 0.3 mg/dL (ref 0.2–1.2)
Total Protein: 7.3 g/dL (ref 6.0–8.3)

## 2022-12-26 LAB — CBC WITH DIFFERENTIAL/PLATELET
Basophils Absolute: 0 10*3/uL (ref 0.0–0.1)
Basophils Relative: 0.4 % (ref 0.0–3.0)
Eosinophils Absolute: 0.2 10*3/uL (ref 0.0–0.7)
Eosinophils Relative: 2.9 % (ref 0.0–5.0)
HCT: 42.1 % (ref 39.0–52.0)
Hemoglobin: 14.1 g/dL (ref 13.0–17.0)
Lymphocytes Relative: 36.7 % (ref 12.0–46.0)
Lymphs Abs: 2 10*3/uL (ref 0.7–4.0)
MCHC: 33.4 g/dL (ref 30.0–36.0)
MCV: 88.8 fl (ref 78.0–100.0)
Monocytes Absolute: 0.5 10*3/uL (ref 0.1–1.0)
Monocytes Relative: 8.5 % (ref 3.0–12.0)
Neutro Abs: 2.8 10*3/uL (ref 1.4–7.7)
Neutrophils Relative %: 51.5 % (ref 43.0–77.0)
Platelets: 220 10*3/uL (ref 150.0–400.0)
RBC: 4.74 Mil/uL (ref 4.22–5.81)
RDW: 14.1 % (ref 11.5–15.5)
WBC: 5.4 10*3/uL (ref 4.0–10.5)

## 2022-12-26 LAB — TSH: TSH: 1.74 u[IU]/mL (ref 0.35–5.50)

## 2022-12-26 MED ORDER — TRIAMCINOLONE ACETONIDE 0.1 % EX CREA: 1.0000 | TOPICAL_CREAM | Freq: Two times a day (BID) | CUTANEOUS | 1 refills | Status: DC

## 2022-12-26 NOTE — Assessment & Plan Note (Signed)
Pt's PE WNL.  UTD on Tdap, PNA.  No longer doing colonoscopy or other cancer screenings.  Check labs.  Anticipatory guidance provided.

## 2022-12-26 NOTE — Patient Instructions (Signed)
Follow up in 6 months to recheck BP and cholesterol We'll notify you of your lab results and make any changes if needed Keep up the good work on healthy diet and regular exercise- you look great! Call with any questions or concerns Stay Safe!  Stay Healthy! Have a great summer!!!

## 2022-12-26 NOTE — Assessment & Plan Note (Signed)
Chronic problem.  Excellent control on Ramipril.  Currently asymptomatic.  Check labs due to ACE use but no anticipated med changes.  Will follow.

## 2022-12-26 NOTE — Progress Notes (Signed)
   Subjective:    Patient ID: Jack Randolph, male    DOB: 1933/07/17, 89 y.o.   MRN: [id number]  HPI CPE- UTD on Tdap, PNA.  No longer doing colonoscopy.  Patient Care Team    Relationship Specialty Notifications Start End  Sheliah Hatch, MD PCP - General Family Medicine  12/19/16   Rene Paci, MD Consulting Physician Urology  09/22/20      Health Maintenance  Topic Date Due   Medicare Annual Wellness (AWV)  03/17/2023   INFLUENZA VACCINE  03/23/2023   DTaP/Tdap/Td (2 - Td or Tdap) 12/22/2031   Pneumonia Vaccine 50+ Years old  Completed   Zoster Vaccines- Shingrix  Completed   HPV VACCINES  Aged Out   COVID-19 Vaccine  Discontinued      Review of Systems Patient reports no vision/hearing changes, anorexia, fever ,adenopathy, persistant/recurrent hoarseness, swallowing issues, chest pain, palpitations, edema, persistant/recurrent cough, hemoptysis, dyspnea (rest,exertional, paroxysmal nocturnal), gastrointestinal  bleeding (melena, rectal bleeding), abdominal pain, excessive heart burn, GU symptoms (dysuria, hematuria, voiding/incontinence issues) syncope, focal weakness, memory loss, numbness & tingling, skin/hair/nail changes, depression, anxiety, abnormal bruising/bleeding, musculoskeletal symptoms/signs.     Objective:   Physical Exam General Appearance:    Alert, cooperative, no distress, appears stated age  Head:    Normocephalic, without obvious abnormality, atraumatic  Eyes:    PERRL, conjunctiva/corneas clear, EOM's intact both eyes       Ears:    Normal TM's and external ear canals, both ears  Nose:   Nares normal, septum midline, mucosa normal, no drainage   or sinus tenderness  Throat:   Lips, mucosa, and tongue normal; teeth and gums normal  Neck:   Supple, symmetrical, trachea midline, no adenopathy;       thyroid:  No enlargement/tenderness/nodules  Back:     Symmetric, no curvature, ROM normal, no CVA tenderness  Lungs:     Clear to  auscultation bilaterally, respirations unlabored  Chest wall:    No tenderness or deformity  Heart:    Regular rate and rhythm, S1 and S2 normal, no murmur, rub   or gallop  Abdomen:     Soft, non-tender, bowel sounds active all four quadrants,    no masses, no organomegaly  Genitalia:    deferred  Rectal:    Extremities:   Extremities normal, atraumatic, no cyanosis or edema  Pulses:   2+ and symmetric all extremities  Skin:   Skin color, texture, turgor normal, no rashes or lesions  Lymph nodes:   Cervical, supraclavicular, and axillary nodes normal  Neurologic:   CNII-XII intact. Normal strength, sensation and reflexes      throughout          Assessment & Plan:

## 2022-12-27 ENCOUNTER — Telehealth: Payer: Self-pay

## 2022-12-27 NOTE — Telephone Encounter (Signed)
-----   Message from Sheliah Hatch, MD sent at 12/27/2022  7:10 AM EDT ----- Labs are stable and look good.  No changes at this time

## 2022-12-27 NOTE — Telephone Encounter (Signed)
Pt seen results Via my chart  

## 2023-01-20 ENCOUNTER — Other Ambulatory Visit: Payer: Self-pay | Admitting: Family Medicine

## 2023-02-01 ENCOUNTER — Encounter: Payer: Self-pay | Admitting: Family Medicine

## 2023-02-01 ENCOUNTER — Telehealth: Payer: Self-pay | Admitting: Family Medicine

## 2023-02-01 ENCOUNTER — Ambulatory Visit (INDEPENDENT_AMBULATORY_CARE_PROVIDER_SITE_OTHER): Payer: Medicare HMO | Admitting: Family Medicine

## 2023-02-01 VITALS — BP 138/82 | HR 87 | Temp 98.4°F | Resp 17 | Ht 71.0 in | Wt 196.0 lb

## 2023-02-01 DIAGNOSIS — R350 Frequency of micturition: Secondary | ICD-10-CM

## 2023-02-01 DIAGNOSIS — R300 Dysuria: Secondary | ICD-10-CM | POA: Diagnosis not present

## 2023-02-01 LAB — POCT URINALYSIS DIPSTICK
Glucose, UA: NEGATIVE
Spec Grav, UA: 1.015 (ref 1.010–1.025)
Urobilinogen, UA: 0.2 E.U./dL
pH, UA: 6 (ref 5.0–8.0)

## 2023-02-01 MED ORDER — CIPROFLOXACIN HCL 500 MG PO TABS: 500.0000 mg | ORAL_TABLET | Freq: Two times a day (BID) | ORAL | 0 refills | Status: AC

## 2023-02-01 NOTE — Telephone Encounter (Signed)
Caller name: Khadar Monger  On DPR?: Yes  Call back number: [phone] (mobile)  Provider they see: Sheliah Hatch, MD  Reason for call:  Pt was told by [facility] that his PCP would prescribe ABX for UTI - advise

## 2023-02-01 NOTE — Patient Instructions (Signed)
Follow up as needed or as scheduled START the Cipro twice daily- take w/ food Continue to drink LOTS of fluids Call with any questions or concerns Hang in there!!!

## 2023-02-01 NOTE — Progress Notes (Signed)
   Subjective:    Patient ID: Jack Randolph, male    DOB: 02-28-1933, 89 y.o.   MRN: [id number]  HPI Dysuria- pt reports burning w/ urination x2 days.  No blood.  Has to force his urine stream.  Increased frequency. No pain over bladder but it feels full.  No pain in back.  Pt reports drinking a lot of gatorade, milk, and coffee.     Review of Systems For ROS see HPI     Objective:   Physical Exam Vitals reviewed.  Constitutional:      General: He is not in acute distress.    Appearance: Normal appearance. He is not ill-appearing.  HENT:     Head: Normocephalic and atraumatic.  Abdominal:     Tenderness: There is no right CVA tenderness or left CVA tenderness.  Skin:    General: Skin is warm and dry.  Neurological:     General: No focal deficit present.     Mental Status: He is alert. Mental status is at baseline.  Psychiatric:        Mood and Affect: Mood normal.        Behavior: Behavior normal.        Thought Content: Thought content normal.           Assessment & Plan:  Dysuria- new.  Pt's sxs started 2 days ago.  He reports this is consistent w/ what he felt in 2019 when he had to go to the hospital w/ a UTI.  Despite normal UA, will send urine for cx and start Cipro.  Reviewed supportive care and red flags that should prompt return.  Pt expressed understanding and is in agreement w/ plan.

## 2023-02-01 NOTE — Telephone Encounter (Signed)
I called the pt and he states that [facility] urology told him to see his PCP for a UTI he did  not have to come to their office . He is coming in for burning with urination

## 2023-02-02 ENCOUNTER — Other Ambulatory Visit: Payer: Self-pay

## 2023-02-02 ENCOUNTER — Encounter (HOSPITAL_COMMUNITY): Payer: Self-pay

## 2023-02-02 ENCOUNTER — Telehealth: Payer: Self-pay | Admitting: Family Medicine

## 2023-02-02 ENCOUNTER — Emergency Department (HOSPITAL_COMMUNITY)
Admission: EM | Admit: 2023-02-02 | Discharge: 2023-02-02 | Disposition: A | Discharge status: Home / Self Care | Payer: Medicare HMO | Attending: Emergency Medicine | Admitting: Emergency Medicine

## 2023-02-02 DIAGNOSIS — I10 Essential (primary) hypertension: Secondary | ICD-10-CM | POA: Insufficient documentation

## 2023-02-02 DIAGNOSIS — R300 Dysuria: Secondary | ICD-10-CM | POA: Diagnosis present

## 2023-02-02 DIAGNOSIS — R1030 Lower abdominal pain, unspecified: Secondary | ICD-10-CM | POA: Diagnosis not present

## 2023-02-02 DIAGNOSIS — R339 Retention of urine, unspecified: Secondary | ICD-10-CM | POA: Diagnosis not present

## 2023-02-02 LAB — BASIC METABOLIC PANEL
Anion gap: 12 (ref 5–15)
BUN: 31 mg/dL — ABNORMAL HIGH (ref 8–23)
CO2: 19 mmol/L — ABNORMAL LOW (ref 22–32)
Calcium: 9.1 mg/dL (ref 8.9–10.3)
Chloride: 98 mmol/L (ref 98–111)
Creatinine, Ser: 1.19 mg/dL (ref 0.61–1.24)
GFR, Estimated: 59 mL/min — ABNORMAL LOW (ref >60)
Glucose, Bld: 121 mg/dL — ABNORMAL HIGH (ref 70–99)
Potassium: 3.9 mmol/L (ref 3.5–5.1)
Sodium: 129 mmol/L — ABNORMAL LOW (ref 135–145)

## 2023-02-02 LAB — URINALYSIS, ROUTINE W REFLEX MICROSCOPIC
Bilirubin Urine: NEGATIVE
Glucose, UA: NEGATIVE mg/dL
Hgb urine dipstick: NEGATIVE
Ketones, ur: NEGATIVE mg/dL
Leukocytes,Ua: NEGATIVE
Nitrite: NEGATIVE
Protein, ur: NEGATIVE mg/dL
Specific Gravity, Urine: 1.025 (ref 1.005–1.030)
pH: 6 (ref 5.0–8.0)

## 2023-02-02 LAB — CBC
HCT: 42.6 % (ref 39.0–52.0)
Hemoglobin: 14.5 g/dL (ref 13.0–17.0)
MCH: 29.7 pg (ref 26.0–34.0)
MCHC: 34 g/dL (ref 30.0–36.0)
MCV: 87.1 fL (ref 80.0–100.0)
Platelets: 209 10*3/uL (ref 150–400)
RBC: 4.89 MIL/uL (ref 4.22–5.81)
RDW: 13.2 % (ref 11.5–15.5)
WBC: 8.6 10*3/uL (ref 4.0–10.5)
nRBC: 0 % (ref 0.0–0.2)

## 2023-02-02 LAB — URINE CULTURE
MICRO NUMBER:: 15073643
Result:: NO GROWTH
SPECIMEN QUALITY:: ADEQUATE

## 2023-02-02 NOTE — ED Triage Notes (Signed)
Pt c/o dysuriax3d. Pt states last urinated yesterday around 1200

## 2023-02-02 NOTE — Discharge Instructions (Addendum)
Jack Randolph:  Thank you for allowing [place] to take care of you today.  We hope you begin feeling better soon.  To-Do: Please follow-up with urology.  Call the number provided for this appointment. Additionally, follow-up with your family practice provider. Please return to the Emergency Department or call 911 if you experience chest pain, shortness of breath, severe pain, severe fever, altered mental status, or have any reason to think that you need emergency medical care.  Thank you again.  Hope you feel better soon.  Department of Emergency Medicine [facility]

## 2023-02-02 NOTE — Telephone Encounter (Signed)
He can get OTC AZO urinary pain relief while waiting for the antibiotics to treat his infection

## 2023-02-02 NOTE — Telephone Encounter (Signed)
Pt reports he will go to [facility] and pick this up.

## 2023-02-02 NOTE — ED Provider Notes (Signed)
[place] EMERGENCY DEPARTMENT AT [facility] Provider Note   HPI: Jack Randolph is an 89 year old male with a past medical history as below presenting today with difficulty urinating.  He reports he is currently being treated for a UTI with ciprofloxacin.  He reports since yesterday around noon he has been unable to urinate.  He reports with this he has felt agitated and confused due to his severe pain that he feels over his bladder.  He endorses dysuria preceding his inability to urinate therefore his primary care provider started him on antibiotics.  He has been compliant with these.  He endorses that he has retained urine in the past and has required a Foley.  He denies nausea, vomiting, diarrhea.  Due to worsening pain he presented to the emergency department.  Past Medical History:  Diagnosis Date   Hypertension     History reviewed. No pertinent surgical history.   Social History   Tobacco Use   Smoking status: Never   Smokeless tobacco: Never  Vaping Use   Vaping Use: Never used  Substance Use Topics   Alcohol use: Yes   Drug use: No      Review of Systems  A complete ROS was performed with pertinent positives/negatives noted in the HPI.   Vitals:   02/02/23 1730 02/02/23 1800  BP: (!) 160/95 (!) 147/87  Pulse: 76 68  Resp:  16  Temp:    SpO2: 92% 94%    Physical Exam Vitals and nursing note reviewed.  Constitutional:      General: He is not in acute distress.    Appearance: He is well-developed.  HENT:     Head: Normocephalic and atraumatic.  Eyes:     Conjunctiva/sclera: Conjunctivae normal.  Cardiovascular:     Rate and Rhythm: Normal rate and regular rhythm.     Heart sounds: No murmur heard. Pulmonary:     Effort: Pulmonary effort is normal. No respiratory distress.     Breath sounds: Normal breath sounds.  Abdominal:     Palpations: Abdomen is soft.     Tenderness: There is abdominal tenderness in the suprapubic area.   Musculoskeletal:        General: No swelling.  Skin:    General: Skin is warm and dry.     Capillary Refill: Capillary refill takes less than 2 seconds.  Neurological:     Mental Status: He is alert.     Procedures  MDM:   Lab results:  Results for orders placed or performed during the hospital encounter of 02/02/23 (from the past 24 hour(s))  Basic metabolic panel     Status: Abnormal   Collection Time: 02/02/23  4:46 PM  Result Value Ref Range   Sodium 129 (L) 135 - 145 mmol/L   Potassium 3.9 3.5 - 5.1 mmol/L   Chloride 98 98 - 111 mmol/L   CO2 19 (L) 22 - 32 mmol/L   Glucose, Bld 121 (H) 70 - 99 mg/dL   BUN 31 (H) 8 - 23 mg/dL   Creatinine, Ser 4.40 0.61 - 1.24 mg/dL   Calcium 9.1 8.9 - 10.2 mg/dL   GFR, Estimated 59 (L) >60 mL/min   Anion gap 12 5 - 15  CBC     Status: None   Collection Time: 02/02/23  4:46 PM  Result Value Ref Range   WBC 8.6 4.0 - 10.5 K/uL   RBC 4.89 4.22 - 5.81 MIL/uL   Hemoglobin 14.5 13.0 - 17.0  g/dL   HCT 98.1 19.1 - 47.8 %   MCV 87.1 80.0 - 100.0 fL   MCH 29.7 26.0 - 34.0 pg   MCHC 34.0 30.0 - 36.0 g/dL   RDW 29.5 62.1 - 30.8 %   Platelets 209 150 - 400 K/uL   nRBC 0.0 0.0 - 0.2 %  Urinalysis, Routine w reflex microscopic -Urine, Clean Catch     Status: None   Collection Time: 02/02/23  5:20 PM  Result Value Ref Range   Color, Urine YELLOW YELLOW   APPearance CLEAR CLEAR   Specific Gravity, Urine 1.025 1.005 - 1.030   pH 6.0 5.0 - 8.0   Glucose, UA NEGATIVE NEGATIVE mg/dL   Hgb urine dipstick NEGATIVE NEGATIVE   Bilirubin Urine NEGATIVE NEGATIVE   Ketones, ur NEGATIVE NEGATIVE mg/dL   Protein, ur NEGATIVE NEGATIVE mg/dL   Nitrite NEGATIVE NEGATIVE   Leukocytes,Ua NEGATIVE NEGATIVE   Medical decision making: -Vital signs stable. Patient afebrile, hemodynamically stable, and non-toxic appearing. -Patient's presentation is most consistent with acute complicated illness / injury requiring diagnostic workup.. -Jack Randolph  is a 89 y.o. male presenting to the emergency department with urinary retention.  POCUS ultrasound performed which demonstrates urinary retention with an estimated 500 cc of fluid in the bladder.  Patient is uncomfortable appearing therefore we will proceed with Foley catheter placement.  Foley passed successfully by nursing staff.  Labs obtained and CBC is without acute hematologic abnormality.  BMP shows baseline kidney function with mild hyponatremia but is otherwise unremarkable.  UA is unremarkable and the patient has a urine culture already in process through his family practice team.  Upon reassessment, patient reports complete relief of his symptoms and feels significantly better with Foley catheter.  Will plan to leave in place, apply leg bag.  And have patient follow-up with urology.  Patient reports having a urologist however patient given phone number to call should he have any issues following up with his urologist.  Also instructed to follow-up with his family practice physician and recommended antibiotics per his PCP.  I discussed strict return precautions for ED return.  Discussed follow-up plan as above.  Patient discharged.   Medical Decision Making Amount and/or Complexity of Data Reviewed Labs: ordered. Decision-making details documented in ED Course.     The plan for this patient was discussed with Dr. Jeraldine Loots, who voiced agreement and who oversaw evaluation and treatment of this patient.  Marta Lamas, MD Emergency Medicine, PGY-3  Note: Dragon medical dictation software was used in the creation of this note.   Clinical Impression:  1. Urinary retention         Chase Caller, MD 02/02/23 1815    Gerhard Munch, MD 02/02/23 2302

## 2023-02-02 NOTE — ED Notes (Signed)
Pt with leg bag in place

## 2023-02-02 NOTE — ED Notes (Signed)
Pt discharged to home, NAD noted at time of discharge 

## 2023-02-02 NOTE — Telephone Encounter (Signed)
On going 3 day UTI Seen yesterday Last 2 days haven't been able to sleep; burns to urinate; pills given are not helping much.   Patient would like to know if he should come I=in again or what to do, please advise

## 2023-02-03 ENCOUNTER — Telehealth: Payer: Self-pay

## 2023-02-03 NOTE — Telephone Encounter (Signed)
-----   Message from Sheliah Hatch, MD sent at 02/02/2023  8:07 PM EDT ----- No evidence of UTI.  Since you are still having pain w/ urination-but there is no infection- please call and schedule an appointment with your urologist for a complete evaluation

## 2023-02-07 ENCOUNTER — Telehealth: Payer: Self-pay

## 2023-02-07 NOTE — Telephone Encounter (Signed)
Transition Care Management Follow-up Telephone Call Date of discharge and from where: [facility] 6/13 How have you been since you were released from the hospital? Good and still alive  Any questions or concerns? No  Items Reviewed: Did the pt receive and understand the discharge instructions provided? Yes  Medications obtained and verified? Yes  Other? No  Any new allergies since your discharge? No  Dietary orders reviewed? No Do you have support at home? Yes     Follow up appointments reviewed:  PCP Hospital f/u appt confirmed? No  Scheduled to see  on  @ . Specialist Hospital f/u appt confirmed? Yes  Scheduled to see  on 6/21 @ . Are transportation arrangements needed? No  If their condition worsens, is the pt aware to call PCP or go to the Emergency Dept.? Yes Was the patient provided with contact information for the PCP's office or ED? Yes Was to pt encouraged to call back with questions or concerns? Yes

## 2023-02-10 DIAGNOSIS — N3000 Acute cystitis without hematuria: Secondary | ICD-10-CM | POA: Diagnosis not present

## 2023-02-10 DIAGNOSIS — R339 Retention of urine, unspecified: Secondary | ICD-10-CM | POA: Diagnosis not present

## 2023-02-13 ENCOUNTER — Other Ambulatory Visit: Payer: Self-pay | Admitting: Family Medicine

## 2023-02-14 DIAGNOSIS — N3000 Acute cystitis without hematuria: Secondary | ICD-10-CM | POA: Diagnosis not present

## 2023-02-14 DIAGNOSIS — N401 Enlarged prostate with lower urinary tract symptoms: Secondary | ICD-10-CM | POA: Diagnosis not present

## 2023-02-14 DIAGNOSIS — R339 Retention of urine, unspecified: Secondary | ICD-10-CM | POA: Diagnosis not present

## 2023-02-16 DIAGNOSIS — N401 Enlarged prostate with lower urinary tract symptoms: Secondary | ICD-10-CM | POA: Diagnosis not present

## 2023-02-16 DIAGNOSIS — R3911 Hesitancy of micturition: Secondary | ICD-10-CM | POA: Diagnosis not present

## 2023-02-27 ENCOUNTER — Emergency Department (HOSPITAL_COMMUNITY)
Admission: EM | Admit: 2023-02-27 | Discharge: 2023-02-28 | Disposition: A | Discharge status: Home / Self Care | Payer: Medicare HMO | Attending: Emergency Medicine | Admitting: Emergency Medicine

## 2023-02-27 ENCOUNTER — Encounter (HOSPITAL_COMMUNITY): Payer: Self-pay

## 2023-02-27 DIAGNOSIS — N3000 Acute cystitis without hematuria: Secondary | ICD-10-CM | POA: Insufficient documentation

## 2023-02-27 DIAGNOSIS — R339 Retention of urine, unspecified: Secondary | ICD-10-CM

## 2023-02-27 NOTE — ED Triage Notes (Signed)
Pt to ED with complaint of urinary retention that began this afternoon.Reports dysuria when attempting to urinate. Hx of same - follows urology.

## 2023-02-28 LAB — URINALYSIS, ROUTINE W REFLEX MICROSCOPIC
Bilirubin Urine: NEGATIVE
Glucose, UA: NEGATIVE mg/dL
Hgb urine dipstick: NEGATIVE
Ketones, ur: NEGATIVE mg/dL
Nitrite: NEGATIVE
Protein, ur: NEGATIVE mg/dL
Specific Gravity, Urine: 1.012 (ref 1.005–1.030)
WBC, UA: >50 WBC/hpf (ref 0–5)
pH: 5 (ref 5.0–8.0)

## 2023-02-28 LAB — BASIC METABOLIC PANEL
Anion gap: 11 (ref 5–15)
BUN: 28 mg/dL — ABNORMAL HIGH (ref 8–23)
CO2: 25 mmol/L (ref 22–32)
Calcium: 9.4 mg/dL (ref 8.9–10.3)
Chloride: 102 mmol/L (ref 98–111)
Creatinine, Ser: 1.03 mg/dL (ref 0.61–1.24)
GFR, Estimated: >60 mL/min (ref >60)
Glucose, Bld: 117 mg/dL — ABNORMAL HIGH (ref 70–99)
Potassium: 4.2 mmol/L (ref 3.5–5.1)
Sodium: 138 mmol/L (ref 135–145)

## 2023-02-28 LAB — CBC
HCT: 44.8 % (ref 39.0–52.0)
Hemoglobin: 14.7 g/dL (ref 13.0–17.0)
MCH: 30.4 pg (ref 26.0–34.0)
MCHC: 32.8 g/dL (ref 30.0–36.0)
MCV: 92.8 fL (ref 80.0–100.0)
Platelets: 199 10*3/uL (ref 150–400)
RBC: 4.83 MIL/uL (ref 4.22–5.81)
RDW: 13.2 % (ref 11.5–15.5)
WBC: 7.1 10*3/uL (ref 4.0–10.5)
nRBC: 0 % (ref 0.0–0.2)

## 2023-02-28 MED ORDER — SODIUM CHLORIDE 0.9 % IV SOLN
1.0000 g | Freq: Once | INTRAVENOUS | Status: AC
  Administered 2023-02-28: 1 g via INTRAVENOUS
  Filled 2023-02-28: qty 10

## 2023-02-28 MED ORDER — CEPHALEXIN 500 MG PO CAPS: 500.0000 mg | ORAL_CAPSULE | Freq: Two times a day (BID) | ORAL | 0 refills | Status: DC

## 2023-02-28 NOTE — ED Provider Triage Note (Signed)
  Emergency Medicine Provider Triage Evaluation Note  MRN:  [id number]  Arrival date & time: 02/28/23    Medically screening exam initiated at 12:46 AM.   CC:   Urinary Retention   HPI:  Makoa Satz is a 89 y.o. year-old male presents to the ED with chief complaint of urinary retention.  Seen recently for the same.  Has follow-up with urology this week.  History provided by patient.  ROS:  -As included in HPI PE:   Vitals:   02/27/23 2329 02/27/23 2341  BP: (!) 180/92   Pulse: 66   Resp: 16   Temp: 98.5 F (36.9 C)   SpO2: 100% 100%    Non-toxic appearing No respiratory distress  MDM:  Based on signs and symptoms, urinary retention is highest on my differential, followed by UTI. I've ordered labs and foley in triage to expedite lab/diagnostic workup.  Patient was informed that the remainder of the evaluation will be completed by another provider, this initial triage assessment does not replace that evaluation, and the importance of remaining in the ED until their evaluation is complete.    Roxy Horseman, PA-C 02/28/23 [phone]

## 2023-02-28 NOTE — ED Provider Notes (Signed)
[facility] Emergency Department Provider Note MRN:  [id number]  Arrival date & time: 02/28/23     Chief Complaint   Urinary Retention   History of Present Illness   Jack Randolph is a 89 y.o. year-old male presents to the ED with chief complaint of urinary retention.  Had recent UTI and subsequent urinary retention.  Had a foley catheter, but this was removed about a week ago.  Had been doing well until burning with urination returned today along with urinary retention.  History provided by patient.   Review of Systems  Pertinent positive and negative review of systems noted in HPI.    Physical Exam   Vitals:   02/27/23 2329 02/27/23 2341  BP: (!) 180/92   Pulse: 66   Resp: 16   Temp: 98.5 F (36.9 C)   SpO2: 100% 100%    CONSTITUTIONAL:  well-appearing, NAD NEURO:  Alert and oriented x 3, CN 3-12 grossly intact EYES:  eyes equal and reactive ENT/NECK:  Supple, no stridor  CARDIO:  appears well-perfused  PULM:  No respiratory distress,  GI/GU:  non-distended,  MSK/SPINE:  No gross deformities, no edema, moves all extremities  SKIN:  no rash, atraumatic   *Additional and/or pertinent findings included in MDM below  Diagnostic and Interventional Summary    EKG Interpretation Date/Time:    Ventricular Rate:    PR Interval:    QRS Duration:    QT Interval:    QTC Calculation:   R Axis:      Text Interpretation:         Labs Reviewed  BASIC METABOLIC PANEL - Abnormal; Notable for the following components:      Result Value   Glucose, Bld 117 (*)    BUN 28 (*)    All other components within normal limits  URINALYSIS, ROUTINE W REFLEX MICROSCOPIC - Abnormal; Notable for the following components:   APPearance HAZY (*)    Leukocytes,Ua MODERATE (*)    Bacteria, UA FEW (*)    All other components within normal limits  URINE CULTURE  CBC    No orders to display    Medications  cefTRIAXone (ROCEPHIN) 1 g in sodium  chloride 0.9 % 100 mL IVPB (has no administration in time range)     Procedures  /  Critical Care Procedures  ED Course and Medical Decision Making  I have reviewed the triage vital signs, the nursing notes, and pertinent available records from the EMR.  Social Determinants Affecting Complexity of Care: Patient has no clinically significant social determinants affecting this chief complaint..   ED Course:    Medical Decision Making Patient with urinary retention.  Retaining .  Will place foley and check labs.    Orders Placed This Encounter     cefTRIAXone (ROCEPHIN) 1 g in sodium chloride 0.9 % 100 mL IVPB         Order Specific Question: Antibiotic Indication:         Answer: UTI     cephALEXin (KEFLEX) 500 MG capsule         Sig: Take 1 capsule (500 mg total) by mouth 2 (two) times daily.         Dispense:  14 capsule         Refill:  0  Will have patient take rocephin at home.  Urology follow-up.  Amount and/or Complexity of Data Reviewed Labs: ordered.    Details: Cr normal.  UA concerning for infection,  will treat with rocephin in ED.         Consultants: No consultations were needed in caring for this patient.   Treatment and Plan: Emergency department workup does not suggest an emergent condition requiring admission or immediate intervention beyond  what has been performed at this time. The patient is safe for discharge and has  been instructed to return immediately for worsening symptoms, change in  symptoms or any other concerns    Final Clinical Impressions(s) / ED Diagnoses     ICD-10-CM   1. Urinary retention  R33.9     2. Acute cystitis without hematuria  N30.00       ED Discharge Orders          Ordered    cephALEXin (KEFLEX) 500 MG capsule  2 times daily        02/28/23 0223              Discharge Instructions Discussed with and Provided to Patient:   Discharge Instructions   None      Roxy Horseman,  PA-C 02/28/23 0224    Mesner, Barbara Cower, MD 02/28/23 [phone]

## 2023-03-01 DIAGNOSIS — N3000 Acute cystitis without hematuria: Secondary | ICD-10-CM | POA: Diagnosis not present

## 2023-03-01 DIAGNOSIS — R339 Retention of urine, unspecified: Secondary | ICD-10-CM | POA: Diagnosis not present

## 2023-03-01 LAB — URINE CULTURE: Culture: >=100000 — AB

## 2023-03-02 ENCOUNTER — Telehealth (HOSPITAL_BASED_OUTPATIENT_CLINIC_OR_DEPARTMENT_OTHER): Payer: Self-pay

## 2023-03-02 NOTE — Telephone Encounter (Signed)
Post ED Visit - Positive Culture Follow-up  Culture report reviewed by antimicrobial stewardship pharmacist: [facility] [x]  Francene Finders [place], [place].D. []  Celedonio Miyamoto, Pharm.D., BCPS AQ-ID []  Garvin Fila, Pharm.D., BCPS []  Georgina Pillion, Pharm.D., BCPS []  Jermyn, [address].D., BCPS, AAHIVP []  Estella Husk, Pharm.D., BCPS, AAHIVP []  Lysle Pearl, PharmD, BCPS []  Phillips Climes, PharmD, BCPS []  Agapito Games, PharmD, BCPS []  Verlan Friends, PharmD []  Mervyn Gay, PharmD, BCPS []  Vinnie Level, PharmD  [facility] []  Len Childs, PharmD []  Greer Pickerel, PharmD []  Adalberto Cole, PharmD []  Perlie Gold, Rph []  Lonell Face) Jean Rosenthal, PharmD []  Earl Many, PharmD []  Junita Push, PharmD []  Dorna Leitz, PharmD []  Terrilee Files, PharmD []  Lynann Beaver, PharmD []  Keturah Barre, PharmD []  Loralee Pacas, PharmD []  Bernadene Person, PharmD   Positive urine culture Treated with Cephalexin, organism sensitive to the same and no further patient follow-up is required at this time.  Sandria Senter 03/02/2023, 9:54 AM

## 2023-03-06 ENCOUNTER — Telehealth: Payer: Self-pay

## 2023-03-06 NOTE — Telephone Encounter (Signed)
Transition Care Management Unsuccessful Follow-up Telephone Call  Date of discharge and from where:  02/28/2023 [facility]  Attempts:  1st Attempt  Reason for unsuccessful TCM follow-up call:  No answer/busy  [facility]  [facility] Population Health Community Resource Care Guide   [email or web address]  ?? [phone]   Website: [email or web address]  [email or web address]

## 2023-03-07 ENCOUNTER — Telehealth: Payer: Self-pay

## 2023-03-07 NOTE — Telephone Encounter (Signed)
Transition Care Management Unsuccessful Follow-up Telephone Call  Date of discharge and from where:  02/28/2023 [facility]  Attempts:  2nd Attempt  Reason for unsuccessful TCM follow-up call:  Left voice message  [facility]  [facility] Population Health Community Resource Care Guide   [email or web address]  ?? [phone]   Website: [email or web address]  [email or web address]

## 2023-03-10 ENCOUNTER — Other Ambulatory Visit: Payer: Self-pay | Admitting: Family Medicine

## 2023-03-17 DIAGNOSIS — N3000 Acute cystitis without hematuria: Secondary | ICD-10-CM | POA: Diagnosis not present

## 2023-03-17 DIAGNOSIS — R339 Retention of urine, unspecified: Secondary | ICD-10-CM | POA: Diagnosis not present

## 2023-03-18 ENCOUNTER — Encounter (HOSPITAL_COMMUNITY): Payer: Self-pay | Admitting: Emergency Medicine

## 2023-03-18 ENCOUNTER — Other Ambulatory Visit: Payer: Self-pay

## 2023-03-18 ENCOUNTER — Emergency Department (HOSPITAL_COMMUNITY)
Admission: EM | Admit: 2023-03-18 | Discharge: 2023-03-18 | Disposition: A | Discharge status: Home / Self Care | Payer: Medicare HMO | Attending: Emergency Medicine | Admitting: Emergency Medicine

## 2023-03-18 DIAGNOSIS — Z79899 Other long term (current) drug therapy: Secondary | ICD-10-CM | POA: Insufficient documentation

## 2023-03-18 DIAGNOSIS — N390 Urinary tract infection, site not specified: Secondary | ICD-10-CM

## 2023-03-18 DIAGNOSIS — I10 Essential (primary) hypertension: Secondary | ICD-10-CM | POA: Insufficient documentation

## 2023-03-18 DIAGNOSIS — R300 Dysuria: Secondary | ICD-10-CM | POA: Diagnosis present

## 2023-03-18 LAB — URINALYSIS, ROUTINE W REFLEX MICROSCOPIC
Bilirubin Urine: NEGATIVE
Glucose, UA: NEGATIVE mg/dL
Ketones, ur: NEGATIVE mg/dL
Nitrite: NEGATIVE
Protein, ur: 100 mg/dL — AB
Specific Gravity, Urine: 1.016 (ref 1.005–1.030)
WBC, UA: >50 WBC/hpf (ref 0–5)
pH: 5 (ref 5.0–8.0)

## 2023-03-18 LAB — CBC
HCT: 41.6 % (ref 39.0–52.0)
Hemoglobin: 13.5 g/dL (ref 13.0–17.0)
MCH: 30.1 pg (ref 26.0–34.0)
MCHC: 32.5 g/dL (ref 30.0–36.0)
MCV: 92.9 fL (ref 80.0–100.0)
Platelets: 222 10*3/uL (ref 150–400)
RBC: 4.48 MIL/uL (ref 4.22–5.81)
RDW: 13.2 % (ref 11.5–15.5)
WBC: 8.1 10*3/uL (ref 4.0–10.5)
nRBC: 0 % (ref 0.0–0.2)

## 2023-03-18 LAB — COMPREHENSIVE METABOLIC PANEL
ALT: 17 U/L (ref 0–44)
AST: 18 U/L (ref 15–41)
Albumin: 3.6 g/dL (ref 3.5–5.0)
Alkaline Phosphatase: 62 U/L (ref 38–126)
Anion gap: 10 (ref 5–15)
BUN: 32 mg/dL — ABNORMAL HIGH (ref 8–23)
CO2: 24 mmol/L (ref 22–32)
Calcium: 8.7 mg/dL — ABNORMAL LOW (ref 8.9–10.3)
Chloride: 102 mmol/L (ref 98–111)
Creatinine, Ser: 1.16 mg/dL (ref 0.61–1.24)
GFR, Estimated: >60 mL/min (ref >60)
Glucose, Bld: 111 mg/dL — ABNORMAL HIGH (ref 70–99)
Potassium: 4.2 mmol/L (ref 3.5–5.1)
Sodium: 136 mmol/L (ref 135–145)
Total Bilirubin: 0.8 mg/dL (ref 0.3–1.2)
Total Protein: 7.1 g/dL (ref 6.5–8.1)

## 2023-03-18 MED ORDER — PHENAZOPYRIDINE HCL 200 MG PO TABS: 200.0000 mg | ORAL_TABLET | Freq: Three times a day (TID) | ORAL | 0 refills | Status: DC

## 2023-03-18 MED ORDER — LINEZOLID 600 MG PO TABS: 600.0000 mg | ORAL_TABLET | Freq: Two times a day (BID) | ORAL | 0 refills | Status: DC

## 2023-03-18 MED ORDER — LINEZOLID 600 MG PO TABS
600.0000 mg | ORAL_TABLET | Freq: Once | ORAL | Status: AC
  Administered 2023-03-18: 600 mg via ORAL
  Filled 2023-03-18: qty 1

## 2023-03-18 MED ORDER — PHENAZOPYRIDINE HCL 100 MG PO TABS
200.0000 mg | ORAL_TABLET | Freq: Once | ORAL | Status: AC
  Administered 2023-03-18: 200 mg via ORAL
  Filled 2023-03-18: qty 2

## 2023-03-18 MED ORDER — CEPHALEXIN 500 MG PO CAPS: 500.0000 mg | ORAL_CAPSULE | Freq: Three times a day (TID) | ORAL | 0 refills | Status: DC

## 2023-03-18 MED ORDER — CEPHALEXIN 250 MG PO CAPS
500.0000 mg | ORAL_CAPSULE | Freq: Once | ORAL | Status: AC
  Administered 2023-03-18: 500 mg via ORAL
  Filled 2023-03-18: qty 2

## 2023-03-18 NOTE — ED Notes (Signed)
Pt d/c home per MD order. Discharge summary reviewed with pt, pt verbalizes understanding. Ambulatory off unit. No s/s of acute distress noted at discharge.,  °

## 2023-03-18 NOTE — ED Provider Notes (Signed)
[place] EMERGENCY DEPARTMENT AT [facility] Provider Note   CSN: [id number] Arrival date & time: 03/18/23  1245     History  Chief Complaint  Patient presents with   Dysuria    Jack Randolph is a 89 y.o. male.  HPI    89 year old male comes with chief complaint of burning with urination. Patient has history of hypertension. Recently, had come to emergency room with chief complaint of urinary retention.  He was found to have UTI.  He received antibiotics and also Foley catheter.  He followed up with the urologist 2 days ago.  The Foley catheter was removed.  He started feeling well.  He voided without any difficulty yesterday.  However this morning woke up with urinary frequency, burning with urination.  He also feels like he is not voiding like he should.  Patient denies any associated fevers, chills.  No blood in the urine.  Home Medications Prior to Admission medications   Medication Sig Start Date End Date Taking? Authorizing Provider  cephALEXin (KEFLEX) 500 MG capsule Take 1 capsule (500 mg total) by mouth 3 (three) times daily. 03/18/23  Yes Derwood Kaplan, MD  linezolid (ZYVOX) 600 MG tablet Take 1 tablet (600 mg total) by mouth 2 (two) times daily. 03/18/23  Yes Derwood Kaplan, MD  phenazopyridine (PYRIDIUM) 200 MG tablet Take 1 tablet (200 mg total) by mouth 3 (three) times daily. 03/18/23  Yes Samanyu Tinnell, Janey Genta, MD  Ascorbic Acid (VITAMIN C) 250 MG CHEW Chew by mouth.    [provider]  bisacodyl (DULCOLAX) 5 MG EC tablet Take 5 mg by mouth daily as needed for moderate constipation.    [provider]  Calcium Carbonate-Vitamin D (CALCIUM 500 + D PO) Take by mouth.    [provider]  Coenzyme Q10 (COQ10) 100 MG CAPS Take by mouth.    [provider]  cyanocobalamin 100 MCG tablet Take 100 mcg by mouth daily.    [provider]  ezetimibe (ZETIA) 10 MG tablet Take 1 tablet (10 mg total) by mouth daily.  06/13/22   Sheliah Hatch, MD  loratadine-pseudoephedrine (CLARITIN-D 24-HOUR) 10-240 MG 24 hr tablet Take 1 tablet by mouth daily.    [provider]  Magnesium 400 MG TABS Take by mouth.    [provider]  Multiple Vitamins-Minerals (CENTRUM MULTI + OMEGA 3 PO) Take by mouth.    [provider]  Multiple Vitamins-Minerals (MULTIVITAMIN ADULT) CHEW Chew by mouth.    [provider]  ramipril (ALTACE) 10 MG capsule Take 1 capsule by mouth once daily 02/13/23   Sheliah Hatch, MD  triamcinolone cream (KENALOG) 0.1 % APPLY  CREAM EXTERNALLY TWICE DAILY 03/10/23   Sheliah Hatch, MD      Allergies    Statins    Review of Systems   Review of Systems  All other systems reviewed and are negative.   Physical Exam Updated Vital Signs BP 129/77   Pulse 64   Temp (!) 97.5 F (36.4 C)   Resp 18   Ht 5\' 11"  (1.803 m)   Wt 86.2 kg   SpO2 100%   BMI 26.50 kg/m  Physical Exam Constitutional:      Appearance: He is well-developed.  HENT:     Head: Normocephalic and atraumatic.  Eyes:     Conjunctiva/sclera: Conjunctivae normal.     Pupils: Pupils are equal, round, and reactive to light.  Cardiovascular:     Rate and Rhythm:  Normal rate and regular rhythm.     Heart sounds: Normal heart sounds.  Pulmonary:     Effort: Pulmonary effort is normal.     Breath sounds: Normal breath sounds.  Abdominal:     General: Bowel sounds are normal. There is no distension.     Palpations: Abdomen is soft.     Tenderness: There is no abdominal tenderness.  Musculoskeletal:     Cervical back: Neck supple.  Skin:    General: Skin is warm.  Neurological:     Mental Status: He is alert and oriented to person, place, and time.     ED Results / Procedures / Treatments   Labs (all labs ordered are listed, but only abnormal results are displayed) Labs Reviewed  COMPREHENSIVE METABOLIC PANEL - Abnormal; Notable for the following components:       Result Value   Glucose, Bld 111 (*)    BUN 32 (*)    Calcium 8.7 (*)    All other components within normal limits  URINALYSIS, ROUTINE W REFLEX MICROSCOPIC - Abnormal; Notable for the following components:   APPearance TURBID (*)    Hgb urine dipstick MODERATE (*)    Protein, ur 100 (*)    Leukocytes,Ua MODERATE (*)    Bacteria, UA MANY (*)    All other components within normal limits  URINE CULTURE  CBC    EKG None  Radiology No results found.  Procedures Procedures    Medications Ordered in ED Medications  linezolid (ZYVOX) tablet 600 mg (600 mg Oral Given 03/18/23 1617)  cephALEXin (KEFLEX) capsule 500 mg (500 mg Oral Given 03/18/23 1617)  phenazopyridine (PYRIDIUM) tablet 200 mg (200 mg Oral Given 03/18/23 1617)    ED Course/ Medical Decision Making/ A&P                             Medical Decision Making Amount and/or Complexity of Data Reviewed Labs: ordered.  Risk Prescription drug management.   This patient presents to the ED with chief complaint(s) of burning with urination with pertinent past medical history of hypertension, recent UTI with urinary retention and Foley catheter placement, with removal of Foley 2 days ago.The complaint involves an extensive differential diagnosis and also carries with it a high risk of complications and morbidity.    The differential diagnosis includes : Acute cystitis, prostatitis, urethral trauma due to Foley.  The initial plan is to get UA with urine culture.  I also reviewed patient's urine cultures.  He had Corynebacterium species.  These are not gram-negative bacteria, therefore I will consult pharmacy.   Additional history obtained: Records reviewed  -previous ED visit, previous urine analysis with urine cultures.  Independent labs interpretation:  The following labs were independently interpreted: UA from today which reveals pyuria, bacteriuria.   Consultation: - Consulted or discussed management/test  interpretation with external professional: Consulted pharmacy and discussed patient's previous cultures growing Corynebacterium -which is a gram-positive bacteria.  Pharmacy recommends that we put patient on oral linezolid.  Unfortunately, typically we have gram-negative bacteria that causes UTI.  It will therefore be prudent to put patient on both Keflex and linezolid for now.  We will follow-up on the cultures and de-escalate.  Patient's postvoid residual was 200 cc.  He just voided additional 100+ cc.  He does not need Foley catheter.  Return precautions discussed.   Final Clinical Impression(s) / ED Diagnoses Final diagnoses:  Lower urinary tract infectious disease  Rx / DC Orders ED Discharge Orders          Ordered    cephALEXin (KEFLEX) 500 MG capsule  3 times daily        03/18/23 1626    linezolid (ZYVOX) 600 MG tablet  2 times daily        03/18/23 1626    phenazopyridine (PYRIDIUM) 200 MG tablet  3 times daily        03/18/23 1626              Derwood Kaplan, MD 03/18/23 1626

## 2023-03-18 NOTE — ED Triage Notes (Signed)
Pt presents with dribbling urine and burning while urinating this morning. Pt reports being seen by urologist yesterday who removed indwelling urinary catheter. Denies recent fever. Urologist advised pt to come to ED if he had any new problems.

## 2023-03-18 NOTE — Discharge Instructions (Addendum)
You are seen in the sensory room for burning with urination.  At this time, it does not seem like you have profound urinary retention.  Start taking the antibiotics that are prescribed.  Return to the ER if you are having difficulty with voiding, you start having high fevers, bloody urine.

## 2023-03-21 LAB — URINE CULTURE: Culture: >=100000 — AB

## 2023-03-22 ENCOUNTER — Emergency Department (HOSPITAL_COMMUNITY)
Admission: EM | Admit: 2023-03-22 | Discharge: 2023-03-22 | Disposition: A | Discharge status: Home / Self Care | Payer: Medicare HMO | Source: Home / Self Care

## 2023-03-22 ENCOUNTER — Telehealth (HOSPITAL_BASED_OUTPATIENT_CLINIC_OR_DEPARTMENT_OTHER): Payer: Self-pay

## 2023-03-22 ENCOUNTER — Other Ambulatory Visit: Payer: Self-pay

## 2023-03-22 DIAGNOSIS — R300 Dysuria: Secondary | ICD-10-CM | POA: Diagnosis not present

## 2023-03-22 LAB — URINALYSIS, ROUTINE W REFLEX MICROSCOPIC
Bilirubin Urine: NEGATIVE
Glucose, UA: NEGATIVE mg/dL
Hgb urine dipstick: NEGATIVE
Ketones, ur: NEGATIVE mg/dL
Nitrite: NEGATIVE
Protein, ur: NEGATIVE mg/dL
Specific Gravity, Urine: 1.008 (ref 1.005–1.030)
pH: 6 (ref 5.0–8.0)

## 2023-03-22 MED ORDER — CEFDINIR 300 MG PO CAPS
300.0000 mg | ORAL_CAPSULE | Freq: Two times a day (BID) | ORAL | Status: DC
  Administered 2023-03-22: 300 mg via ORAL
  Filled 2023-03-22: qty 1

## 2023-03-22 MED ORDER — CEFDINIR 300 MG PO CAPS: 300.0000 mg | ORAL_CAPSULE | Freq: Two times a day (BID) | ORAL | 0 refills | Status: AC

## 2023-03-22 NOTE — ED Triage Notes (Signed)
Pt c/o dysuria and trouble urinating started yesterday. Pt states the meds he was given for it, is not working. Pt denies frequent urination.

## 2023-03-22 NOTE — ED Provider Triage Note (Signed)
Emergency Medicine Provider Triage Evaluation Note  Jack Randolph , a 89 y.o. male  was evaluated in triage.  Pt complains of dysuria  Treated for Ciprobacter & Klebsiellalast week with Linezolid and Keflex  Reports improvement of his symptoms until last night, with return of burning dysuria  Had foley 2 weeks ago for urinary retention, removed in urologist office earlier this week  Review of Systems  Positive: Dysuria Negative: Fevers, chills  Physical Exam  BP (!) 161/98   Pulse 74   Temp 97.6 F (36.4 C) (Oral)   Resp 18   Ht 5\' 11"  (1.803 m)   Wt 86.2 kg   SpO2 97%   BMI 26.50 kg/m  Gen:   Awake, no distress   Resp:  Normal effort  MSK:   Moves extremities without difficulty    Medical Decision Making  Medically screening exam initiated at 9:17 AM.  Appropriate orders placed.  Jack Randolph was informed that the remainder of the evaluation will be completed by another provider, this initial triage assessment does not replace that evaluation, and the importance of remaining in the ED until their evaluation is complete.  Recheck UA/culture as needed  May need different antibiotic coverage if concern for persistent infection  Note - pharmacist today reviewed cultures, had recommended start ing patient on cefdinir 300 mg PO BID x 7 days, with concern for resistance to prior antibiotic   Terald Sleeper, MD 03/22/23 [phone]

## 2023-03-22 NOTE — Discharge Instructions (Signed)
Important  The bacteria growing from your last urine culture may have been resistant to the antibiotics you were prescribed last week.  I recommend that you stop taking the linezolid and cephalexin antibiotic.  Instead you will start taking the [facility] antibiotic that was prescribed today.  This is 300 mg tablets, twice daily for 7 days.

## 2023-03-22 NOTE — Progress Notes (Signed)
ED Antimicrobial Stewardship Positive Culture Follow Up   Jack Randolph is an 89 y.o. male who presented to [facility] on 03/18/2023 with a chief complaint of  Chief Complaint  Patient presents with   Dysuria    Recent Results (from the past 720 hour(s))  Urine Culture     Status: Abnormal   Collection Time: 02/28/23  1:28 AM   Specimen: Urine, Clean Catch  Result Value Ref Range Status   Specimen Description URINE, CLEAN CATCH  Final   Special Requests NONE  Final   Culture (A)  Final    >=100,000 COLONIES/mL DIPHTHEROIDS(CORYNEBACTERIUM SPECIES) Standardized susceptibility testing for this organism is not available. Performed at [facility], [address] [place], [place] [address]    Report Status 03/01/2023 FINAL  Final  Remove urinary catheter to obtain Clean Catch urine culture     Status: Abnormal   Collection Time: 03/18/23  1:12 PM   Specimen: Urine, Clean Catch  Result Value Ref Range Status   Specimen Description URINE, CLEAN CATCH  Final   Special Requests   Final    NONE Performed at [facility], [address], [place], [place] [address]    Culture (A)  Final    >=100,000 COLONIES/mL CITROBACTER SPECIES 60,000 COLONIES/mL KLEBSIELLA OXYTOCA    Report Status 03/21/2023 FINAL  Final   Organism ID, Bacteria CITROBACTER SPECIES (A)  Final   Organism ID, Bacteria KLEBSIELLA OXYTOCA (A)  Final      Susceptibility   Citrobacter species - MIC*    CEFEPIME <=0.12 SENSITIVE Sensitive     CEFTRIAXONE <=0.25 SENSITIVE Sensitive     CIPROFLOXACIN <=0.25 SENSITIVE Sensitive     GENTAMICIN <=1 SENSITIVE Sensitive     IMIPENEM <=0.25 SENSITIVE Sensitive     NITROFURANTOIN <=16 SENSITIVE Sensitive     TRIMETH/SULFA <=20 SENSITIVE Sensitive     PIP/TAZO <=4 SENSITIVE Sensitive     * >=100,000 COLONIES/mL CITROBACTER SPECIES   Klebsiella oxytoca - MIC*    AMPICILLIN RESISTANT Resistant     CEFEPIME <=0.12 SENSITIVE Sensitive     CEFTRIAXONE <=0.25  SENSITIVE Sensitive     CIPROFLOXACIN <=0.25 SENSITIVE Sensitive     GENTAMICIN <=1 SENSITIVE Sensitive     IMIPENEM <=0.25 SENSITIVE Sensitive     NITROFURANTOIN <=16 SENSITIVE Sensitive     TRIMETH/SULFA <=20 SENSITIVE Sensitive     AMPICILLIN/SULBACTAM <=2 SENSITIVE Sensitive     PIP/TAZO <=4 SENSITIVE Sensitive     * 60,000 COLONIES/mL KLEBSIELLA OXYTOCA    [x]  Treated with cephalexin and linezolid, organism resistant to prescribed antimicrobial []  Patient discharged originally without antimicrobial agent and treatment is now indicated  New antibiotic prescription: Discontinue cephalexin and linezolid, start cefdinir 300mg  PO BID x 7 days  ED Provider: Alvester Chou, MD   Jack Randolph, Jack Randolph 03/22/2023, 8:13 AM Clinical Pharmacist Monday - Friday phone -  [phone] Saturday - Sunday phone - [phone]

## 2023-03-22 NOTE — ED Provider Notes (Signed)
[place] EMERGENCY DEPARTMENT AT [facility] Provider Note   CSN: [id number] Arrival date & time: 03/22/23  8657     History  Chief Complaint  Patient presents with   Dysuria    Jack Randolph is a 89 y.o. male here with dysuria  Re-evaluated after triage (see my MSE note) and found that pharmacist recommending switching antibiotics due to resistance on prior regiment  HPI     Home Medications Prior to Admission medications   Medication Sig Start Date End Date Taking? Authorizing Provider  Ascorbic Acid (VITAMIN C) 250 MG CHEW Chew by mouth.    [provider]  bisacodyl (DULCOLAX) 5 MG EC tablet Take 5 mg by mouth daily as needed for moderate constipation.    [provider]  Calcium Carbonate-Vitamin D (CALCIUM 500 + D PO) Take by mouth.    [provider]  cephALEXin (KEFLEX) 500 MG capsule Take 1 capsule (500 mg total) by mouth 3 (three) times daily. 03/18/23   Jack Kaplan, MD  Coenzyme Q10 (COQ10) 100 MG CAPS Take by mouth.    [provider]  cyanocobalamin 100 MCG tablet Take 100 mcg by mouth daily.    [provider]  ezetimibe (ZETIA) 10 MG tablet Take 1 tablet (10 mg total) by mouth daily. 06/13/22   Jack Hatch, MD  linezolid (ZYVOX) 600 MG tablet Take 1 tablet (600 mg total) by mouth 2 (two) times daily. 03/18/23   Jack Kaplan, MD  loratadine-pseudoephedrine (CLARITIN-D 24-HOUR) 10-240 MG 24 hr tablet Take 1 tablet by mouth daily.    [provider]  Magnesium 400 MG TABS Take by mouth.    [provider]  Multiple Vitamins-Minerals (CENTRUM MULTI + OMEGA 3 PO) Take by mouth.    [provider]  Multiple Vitamins-Minerals (MULTIVITAMIN ADULT) CHEW Chew by mouth.    [provider]  phenazopyridine (PYRIDIUM) 200 MG tablet Take 1 tablet (200 mg total) by mouth 3 (three) times daily. 03/18/23   Jack Kaplan, MD  ramipril (ALTACE) 10 MG capsule Take 1  capsule by mouth once daily 02/13/23   Jack Hatch, MD  triamcinolone cream (KENALOG) 0.1 % APPLY  CREAM EXTERNALLY TWICE DAILY 03/10/23   Jack Hatch, MD      Allergies    Statins    Review of Systems   Review of Systems  Physical Exam Updated Vital Signs BP (!) 161/98   Pulse 74   Temp 97.6 F (36.4 C) (Oral)   Resp 18   Ht 5\' 11"  (1.803 m)   Wt 86.2 kg   SpO2 97%   BMI 26.50 kg/m  Physical Exam Constitutional:      General: He is not in acute distress. HENT:     Head: Normocephalic and atraumatic.  Eyes:     Conjunctiva/sclera: Conjunctivae normal.     Pupils: Pupils are equal, round, and reactive to light.  Cardiovascular:     Rate and Rhythm: Normal rate and regular rhythm.  Pulmonary:     Effort: Pulmonary effort is normal. No respiratory distress.  Abdominal:     General: There is no distension.     Tenderness: There is no abdominal tenderness.  Skin:    General: Skin is warm and dry.  Neurological:     General: No focal deficit present.     Mental Status: He is alert. Mental status is at baseline.  Psychiatric:        Mood and Affect: Mood  normal.        Behavior: Behavior normal.     ED Results / Procedures / Treatments   Labs (all labs ordered are listed, but only abnormal results are displayed) Labs Reviewed  URINALYSIS, ROUTINE W REFLEX MICROSCOPIC    EKG None  Radiology No results found.  Procedures Procedures    Medications Ordered in ED Medications  cefdinir (OMNICEF) capsule 300 mg (has no administration in time range)    ED Course/ Medical Decision Making/ A&P                                 Medical Decision Making Amount and/or Complexity of Data Reviewed Labs: ordered.  Risk Prescription drug management.   I discussed with the patient changing his antibiotic regimen, per review of his medical records including prior urine culture results.  He will discontinue his current antibiotics and start Omnicef  instead, 30 mg twice daily for 7 days.  Will give him the first dose in the ED.  Otherwise he is not requiring any blood test.  He is not having urinary retention does not require catheter.  Was able to provide a urine sample which was sent again for UA and possible culture.  He is stable for discharge        Final Clinical Impression(s) / ED Diagnoses Final diagnoses:  Dysuria    Rx / DC Orders ED Discharge Orders     None         Kailin Leu, Jack Balo, MD 03/22/23 [phone]

## 2023-03-23 ENCOUNTER — Ambulatory Visit (INDEPENDENT_AMBULATORY_CARE_PROVIDER_SITE_OTHER): Payer: Medicare HMO | Admitting: *Deleted

## 2023-03-23 DIAGNOSIS — Z0000 Encounter for general adult medical examination without abnormal findings: Secondary | ICD-10-CM

## 2023-03-23 NOTE — Patient Instructions (Signed)
Jack Randolph , Thank you for taking time to come for your Medicare Wellness Visit. I appreciate your ongoing commitment to your health goals. Please review the following plan we discussed and let me know if I can assist you in the future.   Screening recommendations/referrals: Colonoscopy: no longer required Recommended yearly ophthalmology/optometry visit for glaucoma screening and checkup Recommended yearly dental visit for hygiene and checkup  Vaccinations: Influenza vaccine: Education provided Pneumococcal vaccine: up to date Tdap vaccine: up to date Shingles vaccine: up to date    Advanced directives: Education provided     Preventive Care 65 Years and Older, Male Preventive care refers to lifestyle choices and visits with your health care provider that can promote health and wellness. What does preventive care include? A yearly physical exam. This is also called an annual well check. Dental exams once or twice a year. Routine eye exams. Ask your health care provider how often you should have your eyes checked. Personal lifestyle choices, including: Daily care of your teeth and gums. Regular physical activity. Eating a healthy diet. Avoiding tobacco and drug use. Limiting alcohol use. Practicing safe sex. Taking low doses of aspirin every day. Taking vitamin and mineral supplements as recommended by your health care provider. What happens during an annual well check? The services and screenings done by your health care provider during your annual well check will depend on your age, overall health, lifestyle risk factors, and family history of disease. Counseling  Your health care provider may ask you questions about your: Alcohol use. Tobacco use. Drug use. Emotional well-being. Home and relationship well-being. Sexual activity. Eating habits. History of falls. Memory and ability to understand (cognition). Work and work Astronomer. Screening  You may have the  following tests or measurements: Height, weight, and BMI. Blood pressure. Lipid and cholesterol levels. These may be checked every 5 years, or more frequently if you are over 3 years old. Skin check. Lung cancer screening. You may have this screening every year starting at age 21 if you have a 30-pack-year history of smoking and currently smoke or have quit within the past 15 years. Fecal occult blood test (FOBT) of the stool. You may have this test every year starting at age 40. Flexible sigmoidoscopy or colonoscopy. You may have a sigmoidoscopy every 5 years or a colonoscopy every 10 years starting at age 51. Prostate cancer screening. Recommendations will vary depending on your family history and other risks. Hepatitis C blood test. Hepatitis B blood test. Sexually transmitted disease (STD) testing. Diabetes screening. This is done by checking your blood sugar (glucose) after you have not eaten for a while (fasting). You may have this done every 1-3 years. Abdominal aortic aneurysm (AAA) screening. You may need this if you are a current or former smoker. Osteoporosis. You may be screened starting at age 61 if you are at high risk. Talk with your health care provider about your test results, treatment options, and if necessary, the need for more tests. Vaccines  Your health care provider may recommend certain vaccines, such as: Influenza vaccine. This is recommended every year. Tetanus, diphtheria, and acellular pertussis (Tdap, Td) vaccine. You may need a Td booster every 10 years. Zoster vaccine. You may need this after age 66. Pneumococcal 13-valent conjugate (PCV13) vaccine. One dose is recommended after age 45. Pneumococcal polysaccharide (PPSV23) vaccine. One dose is recommended after age 84. Talk to your health care provider about which screenings and vaccines you need and how often you need them.  This information is not intended to replace advice given to you by your health care  provider. Make sure you discuss any questions you have with your health care provider. Document Released: 09/04/2015 Document Revised: 04/27/2016 Document Reviewed: 06/09/2015 Elsevier Interactive Patient Education  2017 [employer].  Fall Prevention in the Home Falls can cause injuries. They can happen to people of all ages. There are many things you can do to make your home safe and to help prevent falls. What can I do on the outside of my home? Regularly fix the edges of walkways and driveways and fix any cracks. Remove anything that might make you trip as you walk through a door, such as a raised step or threshold. Trim any bushes or trees on the path to your home. Use bright outdoor lighting. Clear any walking paths of anything that might make someone trip, such as rocks or tools. Regularly check to see if handrails are loose or broken. Make sure that both sides of any steps have handrails. Any raised decks and porches should have guardrails on the edges. Have any leaves, snow, or ice cleared regularly. Use sand or salt on walking paths during winter. Clean up any spills in your garage right away. This includes oil or grease spills. What can I do in the bathroom? Use night lights. Install grab bars by the toilet and in the tub and shower. Do not use towel bars as grab bars. Use non-skid mats or decals in the tub or shower. If you need to sit down in the shower, use a plastic, non-slip stool. Keep the floor dry. Clean up any water that spills on the floor as soon as it happens. Remove soap buildup in the tub or shower regularly. Attach bath mats securely with double-sided non-slip rug tape. Do not have throw rugs and other things on the floor that can make you trip. What can I do in the bedroom? Use night lights. Make sure that you have a light by your bed that is easy to reach. Do not use any sheets or blankets that are too big for your bed. They should not hang down onto the  floor. Have a firm chair that has side arms. You can use this for support while you get dressed. Do not have throw rugs and other things on the floor that can make you trip. What can I do in the kitchen? Clean up any spills right away. Avoid walking on wet floors. Keep items that you use a lot in easy-to-reach places. If you need to reach something above you, use a strong step stool that has a grab bar. Keep electrical cords out of the way. Do not use floor polish or wax that makes floors slippery. If you must use wax, use non-skid floor wax. Do not have throw rugs and other things on the floor that can make you trip. What can I do with my stairs? Do not leave any items on the stairs. Make sure that there are handrails on both sides of the stairs and use them. Fix handrails that are broken or loose. Make sure that handrails are as long as the stairways. Check any carpeting to make sure that it is firmly attached to the stairs. Fix any carpet that is loose or worn. Avoid having throw rugs at the top or bottom of the stairs. If you do have throw rugs, attach them to the floor with carpet tape. Make sure that you have a light switch at the  top of the stairs and the bottom of the stairs. If you do not have them, ask someone to add them for you. What else can I do to help prevent falls? Wear shoes that: Do not have high heels. Have rubber bottoms. Are comfortable and fit you well. Are closed at the toe. Do not wear sandals. If you use a stepladder: Make sure that it is fully opened. Do not climb a closed stepladder. Make sure that both sides of the stepladder are locked into place. Ask someone to hold it for you, if possible. Clearly mark and make sure that you can see: Any grab bars or handrails. First and last steps. Where the edge of each step is. Use tools that help you move around (mobility aids) if they are needed. These include: Canes. Walkers. Scooters. Crutches. Turn on the  lights when you go into a dark area. Replace any light bulbs as soon as they burn out. Set up your furniture so you have a clear path. Avoid moving your furniture around. If any of your floors are uneven, fix them. If there are any pets around you, be aware of where they are. Review your medicines with your doctor. Some medicines can make you feel dizzy. This can increase your chance of falling. Ask your doctor what other things that you can do to help prevent falls. This information is not intended to replace advice given to you by your health care provider. Make sure you discuss any questions you have with your health care provider. Document Released: 06/04/2009 Document Revised: 01/14/2016 Document Reviewed: 09/12/2014 Elsevier Interactive Patient Education  2017 [employer].

## 2023-03-23 NOTE — Progress Notes (Signed)
Subjective:   Jack Randolph is a 89 y.o. male who presents for Medicare Annual/Subsequent preventive examination.  Visit Complete: Virtual  I connected with  Jack Randolph on 03/23/23 by a audio enabled telemedicine application and verified that I am speaking with the correct person using two identifiers.  Patient Location: Home  Provider Location: Home Office  I discussed the limitations of evaluation and management by telemedicine. The patient expressed understanding and agreed to proceed.  Vital Signs: Unable to obtain new vitals due to this being a telehealth visit.   Review of Systems     Cardiac Risk Factors include: advanced age (>75men, >31 women);hypertension;male gender;obesity (BMI >30kg/m2)     Objective:    There were no vitals filed for this visit. There is no height or weight on file to calculate BMI.     03/23/2023    9:07 AM 03/18/2023    1:02 PM 03/16/2022    8:25 AM 06/14/2017    8:46 AM  Advanced Directives  Does Patient Have a Medical Advance Directive? Yes No Yes Yes  Type of Advance Directive Healthcare Power of Attorney  Living will Living will;Healthcare Power of Attorney  Copy of Healthcare Power of Attorney in Chart? No - copy requested   No - copy requested  Would patient like information on creating a medical advance directive?  No - Patient declined      Current Medications (verified) Outpatient Encounter Medications as of 03/23/2023  Medication Sig   Ascorbic Acid (VITAMIN C) 250 MG CHEW Chew by mouth.   bisacodyl (DULCOLAX) 5 MG EC tablet Take 5 mg by mouth daily as needed for moderate constipation.   Calcium Carbonate-Vitamin D (CALCIUM 500 + D PO) Take by mouth.   cefdinir (OMNICEF) 300 MG capsule Take 1 capsule (300 mg total) by mouth 2 (two) times daily for 7 days.   Coenzyme Q10 (COQ10) 100 MG CAPS Take by mouth.   cyanocobalamin 100 MCG tablet Take 100 mcg by mouth daily.   ezetimibe (ZETIA) 10 MG tablet Take 1 tablet (10  mg total) by mouth daily.   loratadine-pseudoephedrine (CLARITIN-D 24-HOUR) 10-240 MG 24 hr tablet Take 1 tablet by mouth daily.   Magnesium 400 MG TABS Take by mouth.   Multiple Vitamins-Minerals (CENTRUM MULTI + OMEGA 3 PO) Take by mouth.   Multiple Vitamins-Minerals (MULTIVITAMIN ADULT) CHEW Chew by mouth.   phenazopyridine (PYRIDIUM) 200 MG tablet Take 1 tablet (200 mg total) by mouth 3 (three) times daily.   ramipril (ALTACE) 10 MG capsule Take 1 capsule by mouth once daily   triamcinolone cream (KENALOG) 0.1 % APPLY  CREAM EXTERNALLY TWICE DAILY   No facility-administered encounter medications on file as of 03/23/2023.    Allergies (verified) Statins   History: Past Medical History:  Diagnosis Date   Hypertension    History reviewed. No pertinent surgical history. Family History  Problem Relation Age of Onset   Healthy Mother    COPD Father    Social History   Socioeconomic History   Marital status: Married    Spouse name: Not on file   Number of children: 5   Years of education: Not on file   Highest education level: Not on file  Occupational History   Occupation: Holiday representative  Tobacco Use   Smoking status: Never   Smokeless tobacco: Never  Vaping Use   Vaping status: Never Used  Substance and Sexual Activity   Alcohol use: Yes   Drug use: No  Sexual activity: Not Currently  Other Topics Concern   Not on file  Social History Narrative   Not on file   Social Determinants of Health   Financial Resource Strain: Low Risk  (03/23/2023)   Overall Financial Resource Strain (CARDIA)    Difficulty of Paying Living Expenses: Not hard at all  Food Insecurity: No Food Insecurity (03/23/2023)   Hunger Vital Sign    Worried About Running Out of Food in the Last Year: Never true    Ran Out of Food in the Last Year: Never true  Transportation Needs: No Transportation Needs (03/23/2023)   PRAPARE - Administrator, Civil Service (Medical): No    Lack of  Transportation (Non-Medical): No  Physical Activity: Inactive (03/23/2023)   Exercise Vital Sign    Days of Exercise per Week: 0 days    Minutes of Exercise per Session: 0 min  Stress: No Stress Concern Present (03/23/2023)   [employer] of Occupational Health - Occupational Stress Questionnaire    Feeling of Stress : Not at all  Social Connections: Moderately Integrated (03/23/2023)   Social Connection and Isolation Panel [NHANES]    Frequency of Communication with Friends and Family: More than three times a week    Frequency of Social Gatherings with Friends and Family: Twice a week    Attends Religious Services: More than 4 times per year    Active Member of [employer] or Organizations: No    Attends Engineer, structural: Never    Marital Status: Married    Tobacco Counseling Counseling given: Not Answered   Clinical Intake:  Pre-visit preparation completed: Yes  Pain : No/denies pain     Diabetes: No  How often do you need to have someone help you when you read instructions, pamphlets, or other written materials from your doctor or pharmacy?: 1 - Never  Interpreter Needed?: No  Information entered by :: Remi Haggard LPN   Activities of Daily Living    03/23/2023    9:09 AM 12/26/2022    8:41 AM  In your present state of health, do you have any difficulty performing the following activities:  Hearing? 1 0  Vision?  0  Difficulty concentrating or making decisions? 0 0  Walking or climbing stairs? 0 0  Dressing or bathing? 0 0  Doing errands, shopping? 0   Preparing Food and eating ? N   Using the Toilet? N   In the past six months, have you accidently leaked urine? N   Do you have problems with loss of bowel control? N   Managing your Medications? N   Managing your Finances? N     Patient Care Team: Sheliah Hatch, MD as PCP - General (Family Medicine) Rene Paci, MD as Consulting Physician (Urology)  Indicate any recent Medical  Services you may have received from other than [facility] providers in the past year (date may be approximate).     Assessment:   This is a routine wellness examination for Jack Randolph.  Hearing/Vision screen Hearing Screening - Comments:: Does not wear hearing aids Does have some hearing loss Vision Screening - Comments:: Up to date Unsure of name  Dietary issues and exercise activities discussed:     Goals Addressed             This Visit's Progress    Patient Stated       Continue current lifestyle       Depression Screen    03/23/2023  9:13 AM 02/01/2023    2:57 PM 12/26/2022    8:41 AM 06/13/2022    8:06 AM 03/16/2022    8:26 AM 03/16/2022    8:23 AM 06/21/2021    8:43 AM  PHQ 2/9 Scores  PHQ - 2 Score 0 0 0 0 0 0 0  PHQ- 9 Score 0 0 0 0   0    Fall Risk    03/23/2023    9:06 AM 02/01/2023    2:57 PM 12/26/2022    8:41 AM 06/13/2022    8:06 AM 03/16/2022    8:26 AM  Fall Risk   Falls in the past year? 0 0 0 0 0  Number falls in past yr: 0 0 0  0  Injury with Fall? 0 0 0  0  Risk for fall due to :  No Fall Risks No Fall Risks No Fall Risks   Follow up Falls evaluation completed;Education provided;Falls prevention discussed Falls evaluation completed Falls evaluation completed Falls evaluation completed Falls evaluation completed;Education provided    MEDICARE RISK AT HOME:  Medicare Risk at Home - 03/23/23 0907     Any stairs in or around the home? Yes    If so, are there any without handrails? No    Home free of loose throw rugs in walkways, pet beds, electrical cords, etc? Yes    Adequate lighting in your home to reduce risk of falls? Yes    Life alert? No    Use of a cane, walker or w/c? No    Grab bars in the bathroom? Yes    Shower chair or bench in shower? No    Elevated toilet seat or a handicapped toilet? No             TIMED UP AND GO:  Was the test performed?  No    Cognitive Function:    06/14/2017    8:48 AM  MMSE - Mini Mental State Exam   Orientation to time 5  Orientation to Place 5  Registration 3  Attention/ Calculation 3  Recall 3  Language- name 2 objects 2  Language- repeat 1  Language- follow 3 step command 3  Language- read & follow direction 1  Write a sentence 1  Copy design 1  Total score 28        03/23/2023    9:09 AM  6CIT Screen  What Year? 0 points  What month? 0 points  What time? 0 points  Count back from 20 2 points  Months in reverse 0 points  Repeat phrase 0 points  Total Score 2 points    Immunizations Immunization History  Administered Date(s) Administered   Influenza Split 05/06/2022   Influenza, High Dose Seasonal PF 04/22/2019, 04/21/2020   Influenza,inj,Quad PF,6+ Mos 04/11/2017, 04/11/2017   Influenza-Unspecified 10/05/2010, 04/25/2018, 05/04/2021   PFIZER(Purple Top)SARS-COV-2 Vaccination 09/13/2019, 10/04/2019, 06/08/2020, 02/27/2021   Pfizer Covid-19 Vaccine Bivalent Booster 90yrs & up 05/25/2021   Pneumococcal Conjugate-13 04/17/2015   Pneumococcal Polysaccharide-23 03/22/2016   Pneumococcal-Unspecified 10/05/2010   Tdap 12/21/2021   Zoster Recombinant(Shingrix) 12/10/2021, 02/15/2022    TDAP status: Up to date  Flu Vaccine status: Due, Education has been provided regarding the importance of this vaccine. Advised may receive this vaccine at local pharmacy or Health Dept. Aware to provide a copy of the vaccination record if obtained from local pharmacy or Health Dept. Verbalized acceptance and understanding.  Pneumococcal vaccine status: Up to date  Covid-19 vaccine status: Information provided on  how to obtain vaccines.   Qualifies for Shingles Vaccine? No   Zostavax completed Yes   Shingrix Completed?: Yes  Screening Tests Health Maintenance  Topic Date Due   INFLUENZA VACCINE  03/23/2023   Medicare Annual Wellness (AWV)  03/22/2024   DTaP/Tdap/Td (2 - Td or Tdap) 12/22/2031   Pneumonia Vaccine 70+ Years old  Completed   Zoster Vaccines- Shingrix  Completed    HPV VACCINES  Aged Out   COVID-19 Vaccine  Discontinued    Health Maintenance  Health Maintenance Due  Topic Date Due   INFLUENZA VACCINE  03/23/2023    Colorectal cancer screening: No longer required.   Lung Cancer Screening: (Low Dose CT Chest recommended if Age 40-80 years, 20 pack-year currently smoking OR have quit w/in 15years.) does not qualify.   Lung Cancer Screening Referral:   Additional Screening:  Hepatitis C Screening: does not qualify;   Vision Screening: Recommended annual ophthalmology exams for early detection of glaucoma and other disorders of the eye. Is the patient up to date with their annual eye exam?  Yes  Who is the provider or what is the name of the office in which the patient attends annual eye exams? Unsure of name If pt is not established with a provider, would they like to be referred to a provider to establish care? No .   Dental Screening: Recommended annual dental exams for proper oral hygiene   Community Resource Referral / Chronic Care Management: CRR required this visit?  No   CCM required this visit?  No     Plan:     I have personally reviewed and noted the following in the patient's chart:   Medical and social history Use of alcohol, tobacco or illicit drugs  Current medications and supplements including opioid prescriptions. Patient is not currently taking opioid prescriptions. Functional ability and status Nutritional status Physical activity Advanced directives List of other physicians Hospitalizations, surgeries, and ER visits in previous 12 months Vitals Screenings to include cognitive, depression, and falls Referrals and appointments  In addition, I have reviewed and discussed with patient certain preventive protocols, quality metrics, and best practice recommendations. A written personalized care plan for preventive services as well as general preventive health recommendations were provided to patient.      Remi Haggard, LPN   08/27/1094   After Visit Summary:   Nurse Notes:

## 2023-03-27 ENCOUNTER — Telehealth: Payer: Self-pay

## 2023-03-27 NOTE — Telephone Encounter (Signed)
Transition Care Management Unsuccessful Follow-up Telephone Call  Date of discharge and from where:  [facility]  Attempts:  1st Attempt  Reason for unsuccessful TCM follow-up call:  No answer/busy   Lenard Forth [facility] Guide, [facility] [phone] [address] [place], [place], [place] [address] Phone: [phone] Email: [email or web address]

## 2023-03-27 NOTE — Telephone Encounter (Signed)
Transition Care Management Unsuccessful Follow-up Telephone Call  Date of discharge and from where:  [facility]  Attempts:  2nd Attempt  Reason for unsuccessful TCM follow-up call:  No answer/busy

## 2023-03-28 ENCOUNTER — Telehealth: Payer: Self-pay

## 2023-03-28 NOTE — Telephone Encounter (Signed)
Transition Care Management Unsuccessful Follow-up Telephone Call  Date of discharge and from where:  03/22/2023 [facility]  Attempts:  1st Attempt  Reason for unsuccessful TCM follow-up call:  Left voice message  Willodene Stallings [facility]  [facility] Population Health Community Resource Care Guide   [email or web address]  ?? [phone]   Website: [email or web address]  [email or web address]

## 2023-03-28 NOTE — Telephone Encounter (Signed)
Transition Care Management Unsuccessful Follow-up Telephone Call  Date of discharge and from where:  03/22/2023 [facility]  Attempts:  2nd Attempt  Reason for unsuccessful TCM follow-up call:  Left voice message  Rylann Munford [facility]  [facility] Population Health Community Resource Care Guide   [email or web address]  ?? [phone]   Website: [email or web address]  [email or web address]

## 2023-03-29 DIAGNOSIS — N401 Enlarged prostate with lower urinary tract symptoms: Secondary | ICD-10-CM | POA: Diagnosis not present

## 2023-03-29 DIAGNOSIS — N3000 Acute cystitis without hematuria: Secondary | ICD-10-CM | POA: Diagnosis not present

## 2023-03-29 DIAGNOSIS — R339 Retention of urine, unspecified: Secondary | ICD-10-CM | POA: Diagnosis not present

## 2023-04-11 ENCOUNTER — Other Ambulatory Visit: Payer: Self-pay | Admitting: Family Medicine

## 2023-04-26 DIAGNOSIS — R3914 Feeling of incomplete bladder emptying: Secondary | ICD-10-CM | POA: Diagnosis not present

## 2023-05-08 DIAGNOSIS — R3914 Feeling of incomplete bladder emptying: Secondary | ICD-10-CM | POA: Diagnosis not present

## 2023-05-08 DIAGNOSIS — N401 Enlarged prostate with lower urinary tract symptoms: Secondary | ICD-10-CM | POA: Diagnosis not present

## 2023-05-16 ENCOUNTER — Encounter (HOSPITAL_COMMUNITY): Payer: Self-pay | Admitting: Emergency Medicine

## 2023-05-16 ENCOUNTER — Emergency Department (HOSPITAL_COMMUNITY)
Admission: EM | Admit: 2023-05-16 | Discharge: 2023-05-16 | Disposition: A | Discharge status: Home / Self Care | Payer: Medicare HMO | Attending: Emergency Medicine | Admitting: Emergency Medicine

## 2023-05-16 DIAGNOSIS — Z79899 Other long term (current) drug therapy: Secondary | ICD-10-CM | POA: Insufficient documentation

## 2023-05-16 DIAGNOSIS — R339 Retention of urine, unspecified: Secondary | ICD-10-CM | POA: Diagnosis not present

## 2023-05-16 DIAGNOSIS — I10 Essential (primary) hypertension: Secondary | ICD-10-CM | POA: Diagnosis not present

## 2023-05-16 DIAGNOSIS — N3001 Acute cystitis with hematuria: Secondary | ICD-10-CM | POA: Insufficient documentation

## 2023-05-16 DIAGNOSIS — R338 Other retention of urine: Secondary | ICD-10-CM | POA: Diagnosis present

## 2023-05-16 LAB — CBC WITH DIFFERENTIAL/PLATELET
Abs Immature Granulocytes: 0.06 10*3/uL (ref 0.00–0.07)
Basophils Absolute: 0 10*3/uL (ref 0.0–0.1)
Basophils Relative: 0 %
Eosinophils Absolute: 0.1 10*3/uL (ref 0.0–0.5)
Eosinophils Relative: 1 %
HCT: 39 % (ref 39.0–52.0)
Hemoglobin: 12.6 g/dL — ABNORMAL LOW (ref 13.0–17.0)
Immature Granulocytes: 1 %
Lymphocytes Relative: 12 %
Lymphs Abs: 1.1 10*3/uL (ref 0.7–4.0)
MCH: 30.1 pg (ref 26.0–34.0)
MCHC: 32.3 g/dL (ref 30.0–36.0)
MCV: 93.3 fL (ref 80.0–100.0)
Monocytes Absolute: 1.1 10*3/uL — ABNORMAL HIGH (ref 0.1–1.0)
Monocytes Relative: 12 %
Neutro Abs: 6.9 10*3/uL (ref 1.7–7.7)
Neutrophils Relative %: 74 %
Platelets: 172 10*3/uL (ref 150–400)
RBC: 4.18 MIL/uL — ABNORMAL LOW (ref 4.22–5.81)
RDW: 13.7 % (ref 11.5–15.5)
WBC: 9.3 10*3/uL (ref 4.0–10.5)
nRBC: 0 % (ref 0.0–0.2)

## 2023-05-16 LAB — URINALYSIS, W/ REFLEX TO CULTURE (INFECTION SUSPECTED)
Bilirubin Urine: NEGATIVE
Glucose, UA: NEGATIVE mg/dL
Ketones, ur: NEGATIVE mg/dL
Nitrite: POSITIVE — AB
Protein, ur: NEGATIVE mg/dL
Specific Gravity, Urine: 1.015 (ref 1.005–1.030)
WBC, UA: >50 WBC/hpf (ref 0–5)
pH: 5 (ref 5.0–8.0)

## 2023-05-16 LAB — BASIC METABOLIC PANEL
Anion gap: 11 (ref 5–15)
BUN: 32 mg/dL — ABNORMAL HIGH (ref 8–23)
CO2: 21 mmol/L — ABNORMAL LOW (ref 22–32)
Calcium: 8.5 mg/dL — ABNORMAL LOW (ref 8.9–10.3)
Chloride: 101 mmol/L (ref 98–111)
Creatinine, Ser: 1.17 mg/dL (ref 0.61–1.24)
GFR, Estimated: >60 mL/min (ref >60)
Glucose, Bld: 113 mg/dL — ABNORMAL HIGH (ref 70–99)
Potassium: 4.2 mmol/L (ref 3.5–5.1)
Sodium: 133 mmol/L — ABNORMAL LOW (ref 135–145)

## 2023-05-16 MED ORDER — CEPHALEXIN 250 MG PO CAPS
500.0000 mg | ORAL_CAPSULE | Freq: Once | ORAL | Status: AC
  Administered 2023-05-16: 500 mg via ORAL
  Filled 2023-05-16: qty 2

## 2023-05-16 MED ORDER — CEPHALEXIN 500 MG PO CAPS: 500.0000 mg | ORAL_CAPSULE | Freq: Two times a day (BID) | ORAL | 0 refills | Status: AC

## 2023-05-16 NOTE — ED Provider Notes (Signed)
[place] EMERGENCY DEPARTMENT AT [facility] Provider Note   CSN: [id number] Arrival date & time: 05/16/23  0340     History  Chief Complaint  Patient presents with   Urinary Retention    Jack Randolph is a 89 y.o. male.  HPI   89 year old male with medical history significant for hypertension, urinary retention, frequently self caths at home who presents to the emergency department with painful urination.  The patient states that he has been having increasing dysuria.  He is requesting a leg bag with Foley catheter placement as he feels like he is developing urinary tract infection from frequently cathing.  He denies difficulty with catheterization other than increasing pain.  He denies any fevers or chills.  No flank pain.  He endorses suprapubic abdominal pain and difficulty urinating.  He does not regularly self cath but only intermittently self caths at home.  Home Medications Prior to Admission medications   Medication Sig Start Date End Date Taking? Authorizing Provider  cephALEXin (KEFLEX) 500 MG capsule Take 1 capsule (500 mg total) by mouth 2 (two) times daily for 5 days. 05/16/23 05/21/23 Yes Ernie Avena, MD  Ascorbic Acid (VITAMIN C) 250 MG CHEW Chew by mouth.    [provider]  bisacodyl (DULCOLAX) 5 MG EC tablet Take 5 mg by mouth daily as needed for moderate constipation.    [provider]  Calcium Carbonate-Vitamin D (CALCIUM 500 + D PO) Take by mouth.    [provider]  Coenzyme Q10 (COQ10) 100 MG CAPS Take by mouth.    [provider]  cyanocobalamin 100 MCG tablet Take 100 mcg by mouth daily.    [provider]  ezetimibe (ZETIA) 10 MG tablet Take 1 tablet (10 mg total) by mouth daily. 06/13/22   Sheliah Hatch, MD  loratadine-pseudoephedrine (CLARITIN-D 24-HOUR) 10-240 MG 24 hr tablet Take 1 tablet by mouth daily.    [provider]  Magnesium 400 MG TABS Take by mouth.    [provider]  Multiple Vitamins-Minerals (CENTRUM MULTI + Jack 3 PO) Take by mouth.    [provider]  Multiple Vitamins-Minerals (MULTIVITAMIN ADULT) CHEW Chew by mouth.    [provider]  phenazopyridine (PYRIDIUM) 200 MG tablet Take 1 tablet (200 mg total) by mouth 3 (three) times daily. 03/18/23   Derwood Kaplan, MD  ramipril (ALTACE) 10 MG capsule Take 1 capsule by mouth once daily 02/13/23   Sheliah Hatch, MD  triamcinolone cream (KENALOG) 0.1 % APPLY  CREAM EXTERNALLY TWICE DAILY 04/11/23   Sheliah Hatch, MD      Allergies    Statins    Review of Systems   Review of Systems  All other systems reviewed and are negative.   Physical Exam Updated Vital Signs BP (!) 122/90 (BP Location: Right Arm)   Pulse 71   Temp 97.6 F (36.4 C) (Skin)   Resp 17   SpO2 100%  Physical Exam Vitals and nursing note reviewed.  Constitutional:      General: He is not in acute distress.    Appearance: He is well-developed.  HENT:     Head: Normocephalic and atraumatic.  Eyes:     Conjunctiva/sclera: Conjunctivae normal.  Cardiovascular:     Rate and Rhythm: Normal rate and regular rhythm.  Pulmonary:     Effort: Pulmonary effort is normal. No respiratory distress.     Breath sounds: Normal breath sounds.  Abdominal:  Palpations: Abdomen is soft.     Tenderness: There is abdominal tenderness in the suprapubic area.  Musculoskeletal:        General: No swelling.     Cervical back: Neck supple.  Skin:    General: Skin is warm and dry.     Capillary Refill: Capillary refill takes less than 2 seconds.  Neurological:     Mental Status: He is alert.  Psychiatric:        Mood and Affect: Mood normal.     ED Results / Procedures / Treatments   Labs (all labs ordered are listed, but only abnormal results are displayed) Labs Reviewed  BASIC METABOLIC PANEL - Abnormal; Notable for the following components:      Result Value   Sodium 133 (*)    CO2 21  (*)    Glucose, Bld 113 (*)    BUN 32 (*)    Calcium 8.5 (*)    All other components within normal limits  URINALYSIS, W/ REFLEX TO CULTURE (INFECTION SUSPECTED) - Abnormal; Notable for the following components:   APPearance HAZY (*)    Hgb urine dipstick SMALL (*)    Nitrite POSITIVE (*)    Leukocytes,Ua LARGE (*)    Bacteria, UA MANY (*)    Non Squamous Epithelial 0-5 (*)    All other components within normal limits  CBC WITH DIFFERENTIAL/PLATELET - Abnormal; Notable for the following components:   RBC 4.18 (*)    Hemoglobin 12.6 (*)    Monocytes Absolute 1.1 (*)    All other components within normal limits  URINE CULTURE    EKG None  Radiology No results found.  Procedures Procedures    Medications Ordered in ED Medications  cephALEXin (KEFLEX) capsule 500 mg (500 mg Oral Given 05/16/23 5621)    ED Course/ Medical Decision Making/ A&P                                 Medical Decision Making Amount and/or Complexity of Data Reviewed Labs: ordered.  Risk Prescription drug management.    89 year old male with medical history significant for hypertension, urinary retention, frequently self caths at home who presents to the emergency department with painful urination.  The patient states that he has been having increasing dysuria.  He is requesting a leg bag with Foley catheter placement as he feels like he is developing urinary tract infection from frequently cathing.  He denies difficulty with catheterization other than increasing pain.  He denies any fevers or chills.  No flank pain.  He endorses suprapubic abdominal pain and difficulty urinating.  He does not regularly self cath but only intermittently self caths at home.  On arrival, the patient was vitally stable, not meeting SIRS criteria, afebrile, not tachycardic or tachypneic, hemodynamically stable, saturating well on room air.  A bladder scan was performed which revealed 400 cc in the bladder.  Concern for  acute urinary retention.  Although the patient self caths at home, he is endorsing symptoms of urinary tract infection as well.  Foley catheter placement is reasonable at this time.  Discussed this with the patient who is requesting catheterization and antibiotics if his urine is positive for UTI.  Foley catheter placed by nursing.  Labs significant for CBC without a leukocytosis, mild anemia to 12.6, BMP without significant electrolyte abnormality, creatinine at 1.17.  Urinalysis was positive for UTI with nitrates positive, large leukocytes, greater than  50 WBCs and many bacteria present.  Reviewed the patient's old urine cultures, he appears to have grown out corynebacterium species with no known sensitivities.  Urine culture was sent and is pending.  Will start the patient on Keflex and advised the patient follow-up closely with his urologist outpatient.  Stable at this time for discharge.   Final Clinical Impression(s) / ED Diagnoses Final diagnoses:  Acute urinary retention  Acute cystitis with hematuria    Rx / DC Orders ED Discharge Orders          Ordered    cephALEXin (KEFLEX) 500 MG capsule  2 times daily        05/16/23 1610              Ernie Avena, MD 05/16/23 2218

## 2023-05-16 NOTE — Discharge Instructions (Addendum)
Recommend you follow-up outpatient with your urologist.  A Foley catheter has been placed.  Your urine did show evidence of urinary tract infection and we have prescribed antibiotics.

## 2023-05-16 NOTE — ED Notes (Signed)
This NT was able to bladder scan Pt twice and resulted in over each time. RN notified

## 2023-05-16 NOTE — ED Triage Notes (Addendum)
Pt states burning when urinating and requesting leg bag with foley. Reports having urinary retention and mostly just dribbling. Feels like bladder full now. Self caths at times and sees urology. He just wants a foley.

## 2023-05-18 LAB — URINE CULTURE: Culture: >=100000 — AB

## 2023-05-19 ENCOUNTER — Telehealth (HOSPITAL_BASED_OUTPATIENT_CLINIC_OR_DEPARTMENT_OTHER): Payer: Self-pay | Admitting: *Deleted

## 2023-05-19 NOTE — Telephone Encounter (Signed)
Post ED Visit - Positive Culture Follow-up: Successful Patient Follow-Up  Culture assessed and recommendations reviewed by:  []  Enzo Bi, Pharm.D. []  Celedonio Miyamoto, Pharm.D., BCPS AQ-ID []  Garvin Fila, Pharm.D., BCPS []  Georgina Pillion, Pharm.D., BCPS []  [place], [address].D., BCPS, AAHIVP []  Estella Husk, Pharm.D., BCPS, AAHIVP []  Lysle Pearl, PharmD, BCPS []  Phillips Climes, PharmD, BCPS []  Agapito Games, PharmD, BCPS []  Verlan Friends, PharmD  Positive urine culture  []  Patient discharged without antimicrobial prescription and treatment is now indicated [x]  Organism is resistant to prescribed ED discharge antimicrobial []  Patient with positive blood cultures  Changes discussed with ED provider: Arthor Captain, PA New antibiotic prescription Cefdinir 300 mg PO BID x 7 days Called to [place], [place], [phone]  Contacted patient, date 05/19/23, time 0945   Lysle Pearl 05/19/2023, 9:46 AM

## 2023-05-19 NOTE — Progress Notes (Signed)
ED Antimicrobial Stewardship Positive Culture Follow Up   Carlton Buskey is an 89 y.o. male who presented to [facility] on 05/16/2023 with a chief complaint of  Chief Complaint  Patient presents with   Urinary Retention    Recent Results (from the past 720 hour(s))  Urine Culture     Status: Abnormal   Collection Time: 05/16/23  4:43 AM   Specimen: Urine, Random  Result Value Ref Range Status   Specimen Description URINE, RANDOM  Final   Special Requests   Final    NONE Reflexed from [id number] Performed at [facility], [address] [place], [place] [address]    Culture >=100,000 COLONIES/mL KLEBSIELLA SPECIES (A)  Final   Report Status 05/18/2023 FINAL  Final   Organism ID, Bacteria KLEBSIELLA SPECIES (A)  Final      Susceptibility   Klebsiella species - MIC*    AMPICILLIN RESISTANT Resistant     CEFEPIME <=0.12 SENSITIVE Sensitive     CEFTRIAXONE <=0.25 SENSITIVE Sensitive     CIPROFLOXACIN <=0.25 SENSITIVE Sensitive     GENTAMICIN <=1 SENSITIVE Sensitive     IMIPENEM <=0.25 SENSITIVE Sensitive     NITROFURANTOIN <=16 SENSITIVE Sensitive     TRIMETH/SULFA <=20 SENSITIVE Sensitive     AMPICILLIN/SULBACTAM <=2 SENSITIVE Sensitive     PIP/TAZO <=4 SENSITIVE Sensitive     * >=100,000 COLONIES/mL KLEBSIELLA SPECIES    [x]  Treated with cephalexin, organism resistant to prescribed antimicrobial []  Patient discharged originally without antimicrobial agent and treatment is now indicated  New antibiotic prescription: DC cephalexin, start cefdinir 300mg  PO BID x 7 days  ED Provider: Arthor Captain, PA-C   Oday Ridings, Drake Leach 05/19/2023, 7:42 AM Clinical Pharmacist Monday - Friday phone -  [phone] Saturday - Sunday phone - [phone]

## 2023-05-29 ENCOUNTER — Emergency Department (HOSPITAL_COMMUNITY)
Admission: EM | Admit: 2023-05-29 | Discharge: 2023-05-29 | Disposition: A | Discharge status: Home / Self Care | Payer: Medicare HMO | Attending: Emergency Medicine | Admitting: Emergency Medicine

## 2023-05-29 ENCOUNTER — Other Ambulatory Visit: Payer: Self-pay

## 2023-05-29 DIAGNOSIS — Z466 Encounter for fitting and adjustment of urinary device: Secondary | ICD-10-CM | POA: Insufficient documentation

## 2023-05-29 DIAGNOSIS — Z978 Presence of other specified devices: Secondary | ICD-10-CM | POA: Insufficient documentation

## 2023-05-29 DIAGNOSIS — T83091A Other mechanical complication of indwelling urethral catheter, initial encounter: Secondary | ICD-10-CM | POA: Diagnosis not present

## 2023-05-29 NOTE — ED Triage Notes (Signed)
Pt arrived POV d/t part on his leg bag leaking at spout where it drains.

## 2023-05-29 NOTE — ED Notes (Signed)
Leg bag changed in triage - leaking at valve only

## 2023-05-29 NOTE — ED Provider Notes (Signed)
[facility]PARTMENT AT [facility] Provider Note   CSN: [id number] Arrival date & time: 05/29/23  0417     History   CC:  catheter problem  Jack Randolph is a 89 y.o. male.  The history is provided by the patient and medical records.   89 y.o. M here requesting new leg bag.  States valve on his for emptying bag is broken so it continues leaking.  Denies issues with foley itself, has been draining fine.  No pain.  No fever/chills.  Home Medications Prior to Admission medications   Medication Sig Start Date End Date Taking? Authorizing Provider  Ascorbic Acid (VITAMIN C) 250 MG CHEW Chew by mouth.    [provider]  bisacodyl (DULCOLAX) 5 MG EC tablet Take 5 mg by mouth daily as needed for moderate constipation.    [provider]  Calcium Carbonate-Vitamin D (CALCIUM 500 + D PO) Take by mouth.    [provider]  Coenzyme Q10 (COQ10) 100 MG CAPS Take by mouth.    [provider]  cyanocobalamin 100 MCG tablet Take 100 mcg by mouth daily.    [provider]  ezetimibe (ZETIA) 10 MG tablet Take 1 tablet (10 mg total) by mouth daily. 06/13/22   Sheliah Hatch, MD  loratadine-pseudoephedrine (CLARITIN-D 24-HOUR) 10-240 MG 24 hr tablet Take 1 tablet by mouth daily.    [provider]  Magnesium 400 MG TABS Take by mouth.    [provider]  Multiple Vitamins-Minerals (CENTRUM MULTI + OMEGA 3 PO) Take by mouth.    [provider]  Multiple Vitamins-Minerals (MULTIVITAMIN ADULT) CHEW Chew by mouth.    [provider]  phenazopyridine (PYRIDIUM) 200 MG tablet Take 1 tablet (200 mg total) by mouth 3 (three) times daily. 03/18/23   Derwood Kaplan, MD  ramipril (ALTACE) 10 MG capsule Take 1 capsule by mouth once daily 02/13/23   Sheliah Hatch, MD  triamcinolone cream (KENALOG) 0.1 % APPLY  CREAM EXTERNALLY TWICE DAILY 04/11/23   Sheliah Hatch, MD      Allergies     Statins    Review of Systems   Review of Systems  Genitourinary:        New leg  bag  All other systems reviewed and are negative.   Physical Exam Updated Vital Signs BP (!) 169/103 (BP Location: Right Arm)   Pulse 70   Temp 98.2 F (36.8 C) (Oral)   Resp 16   Ht 5\' 11"  (1.803 m)   Wt 86.2 kg   SpO2 99%   BMI 26.50 kg/m   Physical Exam Vitals and nursing note reviewed.  Constitutional:      Appearance: He is well-developed.  HENT:     Head: Normocephalic and atraumatic.  Eyes:     Conjunctiva/sclera: Conjunctivae normal.     Pupils: Pupils are equal, round, and reactive to light.  Cardiovascular:     Rate and Rhythm: Normal rate and regular rhythm.     Heart sounds: Normal heart sounds.  Pulmonary:     Effort: Pulmonary effort is normal.     Breath sounds: Normal breath sounds.  Abdominal:     General: Bowel sounds are normal.     Palpations: Abdomen is soft.  Genitourinary:    Comments: Foley in place, draining yellow urine, valve not functional, bag is clamped secondarily to prevent leaking Musculoskeletal:        General: Normal range of motion.  Cervical back: Normal range of motion.  Skin:    General: Skin is warm and dry.  Neurological:     Mental Status: He is alert and oriented to person, place, and time.     ED Results / Procedures / Treatments   Labs (all labs ordered are listed, but only abnormal results are displayed) Labs Reviewed - No data to display  EKG None  Radiology No results found.  Procedures Procedures    Medications Ordered in ED Medications - No data to display  ED Course/ Medical Decision Making/ A&P                                 Medical Decision Making  89 year old male presenting to the ED requesting new leg bag for his Foley catheter.  Apparently the valve is no longer functioning so consistently leaks and less he secondarily clamped this.  He is not having any issues with the Foley itself, no fever,  chills, or abdominal pain.  No leg bag applied, given extra for home as well.  Can follow-up with urology.  Return here for new concerns.  Final Clinical Impression(s) / ED Diagnoses Final diagnoses:  Foley catheter in place    Rx / DC Orders ED Discharge Orders     None         Garlon Hatchet, PA-C 05/29/23 0441    Tilden Fossa, MD 05/29/23 [id number]

## 2023-05-31 ENCOUNTER — Other Ambulatory Visit: Payer: Self-pay | Admitting: Family Medicine

## 2023-05-31 DIAGNOSIS — E785 Hyperlipidemia, unspecified: Secondary | ICD-10-CM

## 2023-06-06 ENCOUNTER — Other Ambulatory Visit: Payer: Self-pay

## 2023-06-06 DIAGNOSIS — I10 Essential (primary) hypertension: Secondary | ICD-10-CM

## 2023-06-06 MED ORDER — RAMIPRIL 10 MG PO CAPS: 10.0000 mg | ORAL_CAPSULE | Freq: Every day | ORAL | 0 refills | Status: DC

## 2023-06-07 ENCOUNTER — Other Ambulatory Visit: Payer: Self-pay

## 2023-06-07 ENCOUNTER — Emergency Department (HOSPITAL_COMMUNITY)
Admission: EM | Admit: 2023-06-07 | Discharge: 2023-06-08 | Discharge status: Against Medical Advice | Payer: Medicare HMO | Attending: Emergency Medicine | Admitting: Emergency Medicine

## 2023-06-07 DIAGNOSIS — Z5321 Procedure and treatment not carried out due to patient leaving prior to being seen by health care provider: Secondary | ICD-10-CM | POA: Insufficient documentation

## 2023-06-07 DIAGNOSIS — Y732 Prosthetic and other implants, materials and accessory gastroenterology and urology devices associated with adverse incidents: Secondary | ICD-10-CM | POA: Insufficient documentation

## 2023-06-07 DIAGNOSIS — T83038A Leakage of other indwelling urethral catheter, initial encounter: Secondary | ICD-10-CM | POA: Insufficient documentation

## 2023-06-07 NOTE — ED Triage Notes (Signed)
Patient requesting urinary leg bag replacement due to leakage this evening , replaced with new bag at triage . No other complaints .

## 2023-06-07 NOTE — ED Notes (Signed)
Pt left AMA after receiving urinary leg bag

## 2023-07-10 DIAGNOSIS — N401 Enlarged prostate with lower urinary tract symptoms: Secondary | ICD-10-CM | POA: Diagnosis not present

## 2023-07-10 DIAGNOSIS — R3914 Feeling of incomplete bladder emptying: Secondary | ICD-10-CM | POA: Diagnosis not present

## 2023-08-09 DIAGNOSIS — R339 Retention of urine, unspecified: Secondary | ICD-10-CM | POA: Diagnosis not present

## 2023-08-28 ENCOUNTER — Other Ambulatory Visit: Payer: Self-pay | Admitting: Family Medicine

## 2023-08-28 DIAGNOSIS — E785 Hyperlipidemia, unspecified: Secondary | ICD-10-CM

## 2023-09-04 ENCOUNTER — Other Ambulatory Visit: Payer: Self-pay | Admitting: Family Medicine

## 2023-09-04 DIAGNOSIS — I10 Essential (primary) hypertension: Secondary | ICD-10-CM

## 2023-09-05 ENCOUNTER — Other Ambulatory Visit: Payer: Self-pay | Admitting: Urology

## 2023-09-05 DIAGNOSIS — N401 Enlarged prostate with lower urinary tract symptoms: Secondary | ICD-10-CM

## 2023-09-11 DIAGNOSIS — R339 Retention of urine, unspecified: Secondary | ICD-10-CM | POA: Diagnosis not present

## 2023-09-11 DIAGNOSIS — R3914 Feeling of incomplete bladder emptying: Secondary | ICD-10-CM | POA: Diagnosis not present

## 2023-09-13 ENCOUNTER — Other Ambulatory Visit: Payer: Self-pay | Admitting: Diagnostic Radiology

## 2023-09-13 ENCOUNTER — Ambulatory Visit
Admission: RE | Admit: 2023-09-13 | Discharge: 2023-09-13 | Disposition: A | Discharge status: Home / Self Care | Payer: Medicare HMO | Source: Ambulatory Visit | Attending: Urology

## 2023-09-13 DIAGNOSIS — N401 Enlarged prostate with lower urinary tract symptoms: Secondary | ICD-10-CM

## 2023-09-13 DIAGNOSIS — R338 Other retention of urine: Secondary | ICD-10-CM | POA: Diagnosis not present

## 2023-09-13 NOTE — Consult Note (Signed)
Chief Complaint: Patient was seen in consultation today for BPH and urinary retention  Referring Physician(s): Winter,Christopher Clifton Custard  History of Present Illness: Jack Randolph is a 89 y.o. male with history of BPH with urinary retention, urinary tract infections, urinary frequency and elevated PSA. Since June 2024, the patient has been dealing with the problems of urinary retention and recurrent urinary tract infections.  Patient has had trials with Foley catheter in place.  Patient currently has a Foley catheter in place.  Based on the urology report from 05/08/2023, the patient had a UDS that showed capacious bladder with unstable contractions and an obstructive voiding pattern and high voiding pressures.  The PVR following that study was greater than 600 mL.  Patient is tolerating the Foley catheter well at this time.  He continues to work and he is very active.  Patient has been given options of TURP versus indwelling catheter versus intermittent catheterization versus prostate artery embolization.  Overall, the patient is very healthy.  No smoking history.  Past medical history is only significant for hypertension.  Patient lives at home with his wife.  Past Medical History:  Diagnosis Date   Hypertension     No past surgical history on file.  Allergies: Statins  Medications: Prior to Admission medications   Medication Sig Start Date End Date Taking? Authorizing Provider  Ascorbic Acid (VITAMIN C) 250 MG CHEW Chew by mouth.    [provider]  bisacodyl (DULCOLAX) 5 MG EC tablet Take 5 mg by mouth daily as needed for moderate constipation.    [provider]  Calcium Carbonate-Vitamin D (CALCIUM 500 + D PO) Take by mouth.    [provider]  Coenzyme Q10 (COQ10) 100 MG CAPS Take by mouth.    [provider]  cyanocobalamin 100 MCG tablet Take 100 mcg by mouth daily.    [provider]  ezetimibe (ZETIA) 10 MG tablet Take 1  tablet by mouth once daily 08/28/23   Sheliah Hatch, MD  loratadine-pseudoephedrine (CLARITIN-D 24-HOUR) 10-240 MG 24 hr tablet Take 1 tablet by mouth daily.    [provider]  Magnesium 400 MG TABS Take by mouth.    [provider]  Multiple Vitamins-Minerals (CENTRUM MULTI + OMEGA 3 PO) Take by mouth.    [provider]  Multiple Vitamins-Minerals (MULTIVITAMIN ADULT) CHEW Chew by mouth.    [provider]  phenazopyridine (PYRIDIUM) 200 MG tablet Take 1 tablet (200 mg total) by mouth 3 (three) times daily. 03/18/23   Derwood Kaplan, MD  ramipril (ALTACE) 10 MG capsule Take 1 capsule by mouth once daily 09/04/23   Sheliah Hatch, MD  triamcinolone cream (KENALOG) 0.1 % APPLY  CREAM EXTERNALLY TWICE DAILY 04/11/23   Sheliah Hatch, MD     Family History  Problem Relation Age of Onset   Healthy Mother    COPD Father     Social History   Socioeconomic History   Marital status: Married    Spouse name: Not on file   Number of children: 5   Years of education: Not on file   Highest education level: Not on file  Occupational History   Occupation: Holiday representative  Tobacco Use   Smoking status: Never   Smokeless tobacco: Never  Vaping Use   Vaping status: Never Used  Substance and Sexual Activity   Alcohol use: Yes   Drug use: No   Sexual activity: Not Currently  Other Topics Concern   Not  on file  Social History Narrative   Not on file   Social Drivers of Health   Financial Resource Strain: Low Risk  (03/23/2023)   Overall Financial Resource Strain (CARDIA)    Difficulty of Paying Living Expenses: Not hard at all  Food Insecurity: No Food Insecurity (03/23/2023)   Hunger Vital Sign    Worried About Running Out of Food in the Last Year: Never true    Ran Out of Food in the Last Year: Never true  Transportation Needs: No Transportation Needs (03/23/2023)   PRAPARE - Administrator, Civil Service (Medical): No    Lack of  Transportation (Non-Medical): No  Physical Activity: Inactive (03/23/2023)   Exercise Vital Sign    Days of Exercise per Week: 0 days    Minutes of Exercise per Session: 0 min  Stress: No Stress Concern Present (03/23/2023)   [employer] of Occupational Health - Occupational Stress Questionnaire    Feeling of Stress : Not at all  Social Connections: Moderately Integrated (03/23/2023)   Social Connection and Isolation Panel [NHANES]    Frequency of Communication with Friends and Family: More than three times a week    Frequency of Social Gatherings with Friends and Family: Twice a week    Attends Religious Services: More than 4 times per year    Active Member of [employer] or Organizations: No    Attends Banker Meetings: Never    Marital Status: Married    ECOG Status: 1 - Symptomatic but completely ambulatory  Review of Systems: A 12 point ROS discussed and pertinent positives are indicated in the HPI above.  All other systems are negative.  Review of Systems  Constitutional: Negative.   Respiratory: Negative.    Cardiovascular: Negative.   Gastrointestinal: Negative.   Genitourinary:  Positive for frequency and urgency.    Vital Signs: BP (!) 156/86 (Patient Position: Sitting)   Pulse 62   Temp 97.8 F (36.6 C) (Oral)   Resp 19     Physical Exam Constitutional:      Appearance: Normal appearance. He is not ill-appearing.  Cardiovascular:     Rate and Rhythm: Normal rate.     Heart sounds: Normal heart sounds.  Pulmonary:     Effort: Pulmonary effort is normal.  Abdominal:     General: Abdomen is flat.     Palpations: Abdomen is soft.  Musculoskeletal:     Right lower leg: No edema.     Left lower leg: No edema.  Neurological:     Mental Status: He is alert.     Mallampati Score: 2     Imaging: No results found.  Labs:  CBC: Recent Labs    02/02/23 1646 02/28/23 0058 03/18/23 1312 05/16/23 0548  WBC 8.6 7.1 8.1 9.3  HGB 14.5 14.7  13.5 12.6*  HCT 42.6 44.8 41.6 39.0  PLT 209 199 222 172    COAGS: No results for input(s): "INR", "APTT" in the last 8760 hours.  BMP: Recent Labs    02/02/23 1646 02/28/23 0058 03/18/23 1312 05/16/23 0548  NA 129* 138 136 133*  K 3.9 4.2 4.2 4.2  CL 98 102 102 101  CO2 19* 25 24 21*  GLUCOSE 121* 117* 111* 113*  BUN 31* 28* 32* 32*  CALCIUM 9.1 9.4 8.7* 8.5*  CREATININE 1.19 1.03 1.16 1.17  GFRNONAA 59* >60 >60 >60    LIVER FUNCTION TESTS: Recent Labs    12/26/22 0858 03/18/23 1312  BILITOT 0.3 0.8  AST 20 18  ALT 21 17  ALKPHOS 66 62  PROT 7.3 7.1  ALBUMIN 4.0 3.6    TUMOR MARKERS: No results for input(s): "AFPTM", "CEA", "CA199", "CHROMGRNA" in the last 8760 hours.  Assessment and Plan:  89 year old with BPH and history of urinary tract infections and urinary retention.  Patient currently has a Foley catheter in place.  Patient is seeking treatment options for his ongoing urinary retention.  IPSS 15 and patient is mostly dissatisfied with his current urinary condition.  Patient appears to be a suitable candidate for a prostate artery embolization procedure. We discussed the prostate artery embolization in depth.   Discussed how the procedure was performed with moderate sedation and that these procedures can be technically difficult especially due to his age.  We discussed the expectations after the procedure including postembolization syndrome and eventually removing the foley catheter. I believe the patient has a good understanding of the risks and benefits of the procedure and he would like to pursue this treatment option.  This procedure will likely be performed by my partner Dr. Marliss Coots and the patient is comfortable with meeting Dr. Elby Showers on the day of the procedure.  We will schedule the patient for a prostate artery embolization and we will need to get a CTA of the pelvis prior to the procedure to evaluate his anatomy.  Thank you for this interesting  consult.  I greatly enjoyed meeting Daman Lerro and look forward to participating in their care.  A copy of this report was sent to the requesting provider on this date.  Electronically Signed: Arn Medal 09/13/2023, 10:31 AM   I spent a total of  30 Minutes   in face to face in clinical consultation, greater than 50% of which was counseling/coordinating care for urinary retention.

## 2023-09-18 ENCOUNTER — Ambulatory Visit
Admission: RE | Admit: 2023-09-18 | Discharge: 2023-09-18 | Disposition: A | Discharge status: Home / Self Care | Payer: Medicare HMO | Source: Ambulatory Visit | Attending: Diagnostic Radiology | Admitting: Diagnostic Radiology

## 2023-09-18 DIAGNOSIS — N401 Enlarged prostate with lower urinary tract symptoms: Secondary | ICD-10-CM | POA: Diagnosis not present

## 2023-09-18 MED ORDER — IOPAMIDOL (ISOVUE-370) INJECTION 76%
100.0000 mL | Freq: Once | INTRAVENOUS | Status: AC | PRN
  Administered 2023-09-18: 100 mL via INTRAVENOUS

## 2023-09-28 ENCOUNTER — Telehealth (HOSPITAL_COMMUNITY): Payer: Self-pay | Admitting: Radiology

## 2023-09-28 ENCOUNTER — Other Ambulatory Visit (HOSPITAL_COMMUNITY): Payer: Self-pay | Admitting: Interventional Radiology

## 2023-09-28 DIAGNOSIS — N138 Other obstructive and reflux uropathy: Secondary | ICD-10-CM

## 2023-09-28 NOTE — Telephone Encounter (Signed)
 Called pt, left VM for him to call back to schedule pt's PAE with Dr. Jinx Mourning on 10/12/23 in the afternoon. JM

## 2023-10-04 ENCOUNTER — Telehealth (HOSPITAL_COMMUNITY): Payer: Self-pay | Admitting: Radiology

## 2023-10-04 ENCOUNTER — Other Ambulatory Visit: Payer: Self-pay | Admitting: Interventional Radiology

## 2023-10-04 DIAGNOSIS — N401 Enlarged prostate with lower urinary tract symptoms: Secondary | ICD-10-CM

## 2023-10-04 NOTE — Telephone Encounter (Signed)
Called pt, left VM for him to call to schedule his PAE with Dr. Elby Showers. Second message left for pt. I then called pt's wife and she states that pt is having some reservations about proceeding and is afraid of complications. She will pass along the message to him to call and let me know if he wants to proceed or not. JM

## 2023-10-09 DIAGNOSIS — R339 Retention of urine, unspecified: Secondary | ICD-10-CM | POA: Diagnosis not present

## 2023-10-09 DIAGNOSIS — N401 Enlarged prostate with lower urinary tract symptoms: Secondary | ICD-10-CM | POA: Diagnosis not present

## 2023-10-31 ENCOUNTER — Other Ambulatory Visit (HOSPITAL_COMMUNITY): Payer: Self-pay | Admitting: Student

## 2023-10-31 ENCOUNTER — Other Ambulatory Visit: Payer: Self-pay | Admitting: Interventional Radiology

## 2023-10-31 DIAGNOSIS — N401 Enlarged prostate with lower urinary tract symptoms: Secondary | ICD-10-CM

## 2023-10-31 DIAGNOSIS — Z01818 Encounter for other preprocedural examination: Secondary | ICD-10-CM

## 2023-10-31 MED ORDER — SOLIFENACIN SUCCINATE 5 MG PO TABS: 5.0000 mg | ORAL_TABLET | Freq: Every day | ORAL | 0 refills | Status: AC

## 2023-10-31 MED ORDER — CIPROFLOXACIN HCL 500 MG PO TABS
500.0000 mg | ORAL_TABLET | Freq: Two times a day (BID) | ORAL | 0 refills | Status: AC
Start: 2023-10-31 — End: 2023-11-07

## 2023-10-31 MED ORDER — PHENAZOPYRIDINE HCL 100 MG PO TABS: 100.0000 mg | ORAL_TABLET | Freq: Three times a day (TID) | ORAL | 0 refills | Status: AC

## 2023-10-31 NOTE — Progress Notes (Signed)
 Patient scheduled for prostate artery embolization with Dr. Elby Showers 11/01/23. Post-procedure medications e-prescribed to the patient's pharmacy. Voice message left for the patient notifying him of these medications with request for patient to call me back.   Alwyn Ren, AGACNP-BC 10/31/2023, 12:05 PM

## 2023-11-01 ENCOUNTER — Other Ambulatory Visit (HOSPITAL_COMMUNITY): Payer: Self-pay | Admitting: Interventional Radiology

## 2023-11-01 ENCOUNTER — Ambulatory Visit (HOSPITAL_COMMUNITY)
Admission: RE | Admit: 2023-11-01 | Discharge: 2023-11-01 | Disposition: A | Discharge status: Home / Self Care | Payer: Medicare HMO | Source: Ambulatory Visit | Attending: Interventional Radiology

## 2023-11-01 ENCOUNTER — Other Ambulatory Visit: Payer: Self-pay

## 2023-11-01 DIAGNOSIS — N401 Enlarged prostate with lower urinary tract symptoms: Secondary | ICD-10-CM

## 2023-11-01 DIAGNOSIS — N138 Other obstructive and reflux uropathy: Secondary | ICD-10-CM

## 2023-11-01 HISTORY — PX: IR EMBO ARTERIAL NOT HEMORR HEMANG INC GUIDE ROADMAPPING: IMG5448

## 2023-11-01 HISTORY — PX: IR ANGIOGRAM SELECTIVE EACH ADDITIONAL VESSEL: IMG667

## 2023-11-01 HISTORY — PX: IR US GUIDE VASC ACCESS RIGHT: IMG2390

## 2023-11-01 HISTORY — PX: IR ANGIOGRAM FOLLOW UP STUDY: IMG697

## 2023-11-01 LAB — BASIC METABOLIC PANEL
Anion gap: 7 (ref 5–15)
BUN: 28 mg/dL — ABNORMAL HIGH (ref 8–23)
CO2: 25 mmol/L (ref 22–32)
Calcium: 8.8 mg/dL — ABNORMAL LOW (ref 8.9–10.3)
Chloride: 106 mmol/L (ref 98–111)
Creatinine, Ser: 1.34 mg/dL — ABNORMAL HIGH (ref 0.61–1.24)
GFR, Estimated: 51 mL/min — ABNORMAL LOW (ref >60)
Glucose, Bld: 102 mg/dL — ABNORMAL HIGH (ref 70–99)
Potassium: 4 mmol/L (ref 3.5–5.1)
Sodium: 138 mmol/L (ref 135–145)

## 2023-11-01 LAB — CBC
HCT: 43.2 % (ref 39.0–52.0)
Hemoglobin: 14.5 g/dL (ref 13.0–17.0)
MCH: 30.2 pg (ref 26.0–34.0)
MCHC: 33.6 g/dL (ref 30.0–36.0)
MCV: 90 fL (ref 80.0–100.0)
Platelets: 196 10*3/uL (ref 150–400)
RBC: 4.8 MIL/uL (ref 4.22–5.81)
RDW: 13.3 % (ref 11.5–15.5)
WBC: 5.7 10*3/uL (ref 4.0–10.5)
nRBC: 0 % (ref 0.0–0.2)

## 2023-11-01 LAB — PROTIME-INR
INR: 1 (ref 0.8–1.2)
Prothrombin Time: 13.8 s (ref 11.4–15.2)

## 2023-11-01 LAB — NO BLOOD PRODUCTS

## 2023-11-01 MED ORDER — FENTANYL CITRATE (PF) 100 MCG/2ML IJ SOLN
INTRAMUSCULAR | Status: AC
  Filled 2023-11-01: qty 2

## 2023-11-01 MED ORDER — IOHEXOL 300 MG/ML  SOLN
100.0000 mL | Freq: Once | INTRAMUSCULAR | Status: AC | PRN
  Administered 2023-11-01: 25 mL via INTRA_ARTERIAL

## 2023-11-01 MED ORDER — CIPROFLOXACIN IN D5W 400 MG/200ML IV SOLN: 400.0000 mg | INTRAVENOUS | Status: DC

## 2023-11-01 MED ORDER — CIPROFLOXACIN IN D5W 400 MG/200ML IV SOLN
INTRAVENOUS | Status: AC | PRN
  Administered 2023-11-01: 400 mg via INTRAVENOUS

## 2023-11-01 MED ORDER — MIDAZOLAM HCL 2 MG/2ML IJ SOLN
INTRAMUSCULAR | Status: AC
  Filled 2023-11-01: qty 2

## 2023-11-01 MED ORDER — CIPROFLOXACIN IN D5W 400 MG/200ML IV SOLN
INTRAVENOUS | Status: AC
  Filled 2023-11-01: qty 200

## 2023-11-01 MED ORDER — LIDOCAINE HCL (PF) 1 % IJ SOLN: 10.0000 mL | Freq: Once | INTRAMUSCULAR | Status: DC

## 2023-11-01 MED ORDER — IOHEXOL 300 MG/ML  SOLN
100.0000 mL | Freq: Once | INTRAMUSCULAR | Status: AC | PRN
  Administered 2023-11-01: 100 mL via INTRA_ARTERIAL

## 2023-11-01 MED ORDER — MIDAZOLAM HCL 2 MG/2ML IJ SOLN
INTRAMUSCULAR | Status: AC
Start: 2023-11-01 — End: ?
  Filled 2023-11-01: qty 4

## 2023-11-01 MED ORDER — PREDNISONE 20 MG PO TABS
20.0000 mg | ORAL_TABLET | Freq: Once | ORAL | Status: AC
  Administered 2023-11-01: 20 mg via ORAL
  Filled 2023-11-01: qty 1

## 2023-11-01 MED ORDER — FENTANYL CITRATE (PF) 100 MCG/2ML IJ SOLN
INTRAMUSCULAR | Status: AC | PRN
  Administered 2023-11-01 (×2): 25 ug via INTRAVENOUS
  Administered 2023-11-01: 50 ug via INTRAVENOUS
  Administered 2023-11-01: 25 ug via INTRAVENOUS

## 2023-11-01 MED ORDER — LIDOCAINE-EPINEPHRINE 1 %-1:100000 IJ SOLN
INTRAMUSCULAR | Status: AC
  Filled 2023-11-01: qty 1

## 2023-11-01 MED ORDER — FENTANYL CITRATE (PF) 100 MCG/2ML IJ SOLN
INTRAMUSCULAR | Status: AC
  Filled 2023-11-01: qty 4

## 2023-11-01 MED ORDER — MIDAZOLAM HCL 2 MG/2ML IJ SOLN
INTRAMUSCULAR | Status: AC | PRN
  Administered 2023-11-01: 1 mg via INTRAVENOUS
  Administered 2023-11-01 (×6): .5 mg via INTRAVENOUS

## 2023-11-01 MED ORDER — NITROGLYCERIN 1 MG/10 ML FOR IR/CATH LAB
INTRA_ARTERIAL | Status: AC
  Filled 2023-11-01: qty 10

## 2023-11-01 NOTE — H&P (Signed)
 Chief Complaint: Patient was seen in consultation today for benign prostatic hyperplasia   Referring Physician(s): Winter,Christopher Clifton Custard   Supervising Physician: Marliss Coots  Patient Status: [facility] - Out-pt  History of Present Illness: Jack Randolph is an 89 y.o. male with history of BPH with urinary retention, urinary tract infections, urinary frequency and elevated PSA. Since June 2024 he has struggled with recurrent UTIs and retention requiring foley catheters. Based on the urology report from 05/08/2023, the patient had a UDS that showed capacious bladder with unstable contractions and an obstructive voiding pattern and high voiding pressures. The PVR following that study was greater than 600 mL.   He was referred to Interventional Radiology for consideration of prostate artery embolization. He met with Dr. Lowella Dandy 09/13/23 and had a foley catheter in place during that consultation. He was tolerating the foley catheter well at that time. He explained that he continues to work and is very active. He had been given options of TURP versus indwelling catheter versus intermittent catheterization versus prostate artery embolization. He was most interested in pursuing prostate artery embolization.  Patient appeared to be a suitable candidate for a prostate artery embolization and the procedure details were discussed in depth. Dr. Lowella Dandy discussed how the procedure was performed with moderate sedation and that these procedures can be technically difficult especially due to his age. They discussed the expectations after the procedure including post-embolization syndrome and eventually removing the foley catheter. Dr. Lowella Dandy also notified the patient that the procedure would likely be performed by Dr. Elby Showers. The patient was comfortable with all the information discussed and was agreeable to proceed. A CTA of the pelvis was completed 09/18/23 to evaluate his anatomy prior to the procedure.   Past  Medical History:  Diagnosis Date   Hypertension     No past surgical history on file.  Allergies: Statins  Medications: Prior to Admission medications   Medication Sig Start Date End Date Taking? Authorizing Provider  Ascorbic Acid (VITAMIN C) 250 MG CHEW Chew by mouth.   Yes [provider]  bisacodyl (DULCOLAX) 5 MG EC tablet Take 5 mg by mouth daily as needed for moderate constipation.   Yes [provider]  Calcium Carbonate-Vitamin D (CALCIUM 500 + D PO) Take by mouth.   Yes [provider]  ciprofloxacin (CIPRO) 500 MG tablet Take 1 tablet (500 mg total) by mouth 2 (two) times daily for 7 days. 10/31/23 11/07/23 Yes Terique Kawabata, Arman Filter, NP  Coenzyme Q10 (COQ10) 100 MG CAPS Take by mouth.   Yes [provider]  cyanocobalamin 100 MCG tablet Take 100 mcg by mouth daily.   Yes [provider]  ezetimibe (ZETIA) 10 MG tablet Take 1 tablet by mouth once daily 08/28/23  Yes Sheliah Hatch, MD  Multiple Vitamins-Minerals (CENTRUM MULTI + OMEGA 3 PO) Take by mouth.   Yes [provider]  Multiple Vitamins-Minerals (MULTIVITAMIN ADULT) CHEW Chew by mouth.   Yes [provider]  phenazopyridine (PYRIDIUM) 100 MG tablet Take 1 tablet (100 mg total) by mouth in the morning, at noon, and at bedtime for 7 days. 10/31/23 11/07/23 Yes Roniel Halloran, Arman Filter, NP  phenazopyridine (PYRIDIUM) 200 MG tablet Take 1 tablet (200 mg total) by mouth 3 (three) times daily. 03/18/23  Yes Derwood Kaplan, MD  ramipril (ALTACE) 10 MG capsule Take 1 capsule by mouth once daily 09/04/23  Yes Sheliah Hatch, MD  solifenacin (VESICARE) 5 MG tablet Take 1 tablet (5 mg total) by mouth  daily for 7 days. 10/31/23 11/07/23 Yes Allyna Pittsley, Asher Muir R, NP  triamcinolone cream (KENALOG) 0.1 % APPLY  CREAM EXTERNALLY TWICE DAILY 04/11/23  Yes Sheliah Hatch, MD  loratadine-pseudoephedrine (CLARITIN-D 24-HOUR) 10-240 MG 24 hr tablet Take 1 tablet by mouth daily.     [provider]  Magnesium 400 MG TABS Take by mouth.    [provider]     Family History  Problem Relation Age of Onset   Healthy Mother    COPD Father     Social History   Socioeconomic History   Marital status: Married    Spouse name: Not on file   Number of children: 5   Years of education: Not on file   Highest education level: Not on file  Occupational History   Occupation: Holiday representative  Tobacco Use   Smoking status: Never   Smokeless tobacco: Never  Vaping Use   Vaping status: Never Used  Substance and Sexual Activity   Alcohol use: Yes   Drug use: No   Sexual activity: Not Currently  Other Topics Concern   Not on file  Social History Narrative   Not on file   Social Drivers of Health   Financial Resource Strain: Low Risk  (03/23/2023)   Overall Financial Resource Strain (CARDIA)    Difficulty of Paying Living Expenses: Not hard at all  Food Insecurity: No Food Insecurity (03/23/2023)   Hunger Vital Sign    Worried About Running Out of Food in the Last Year: Never true    Ran Out of Food in the Last Year: Never true  Transportation Needs: No Transportation Needs (03/23/2023)   PRAPARE - Administrator, Civil Service (Medical): No    Lack of Transportation (Non-Medical): No  Physical Activity: Inactive (03/23/2023)   Exercise Vital Sign    Days of Exercise per Week: 0 days    Minutes of Exercise per Session: 0 min  Stress: No Stress Concern Present (03/23/2023)   [employer] of Occupational Health - Occupational Stress Questionnaire    Feeling of Stress : Not at all  Social Connections: Moderately Integrated (03/23/2023)   Social Connection and Isolation Panel [NHANES]    Frequency of Communication with Friends and Family: More than three times a week    Frequency of Social Gatherings with Friends and Family: Twice a week    Attends Religious Services: More than 4 times per year    Active Member of [employer] or Organizations: No     Attends Banker Meetings: Never    Marital Status: Married    Review of Systems: A 12 point ROS discussed and pertinent positives are indicated in the HPI above.  All other systems are negative.  Review of Systems  All other systems reviewed and are negative.   Vital Signs: BP (!) 169/99   Pulse 77   Temp 97.6 F (36.4 C) (Oral)   Resp 17   Ht 5\' 11"  (1.803 m)   Wt 209 lb (94.8 kg)   SpO2 97%   BMI 29.15 kg/m   Physical Exam Constitutional:      General: He is not in acute distress.    Appearance: He is not ill-appearing.  HENT:     Mouth/Throat:     Mouth: Mucous membranes are moist.     Pharynx: Oropharynx is clear.  Cardiovascular:     Rate and Rhythm: Normal rate and regular rhythm.     Pulses: Normal pulses.  Pulmonary:  Effort: Pulmonary effort is normal.  Abdominal:     Tenderness: There is no abdominal tenderness.  Genitourinary:    Comments: Foley catheter  Skin:    General: Skin is warm and dry.  Neurological:     Mental Status: He is alert and oriented to person, place, and time.  Psychiatric:        Mood and Affect: Mood normal.        Judgment: Judgment normal.     Imaging: No results found.  Labs:  CBC: Recent Labs    02/28/23 0058 03/18/23 1312 05/16/23 0548 11/01/23 0920  WBC 7.1 8.1 9.3 5.7  HGB 14.7 13.5 12.6* 14.5  HCT 44.8 41.6 39.0 43.2  PLT 199 222 172 196    COAGS: Recent Labs    11/01/23 0920  INR 1.0    BMP: Recent Labs    02/28/23 0058 03/18/23 1312 05/16/23 0548 11/01/23 0920  NA 138 136 133* 138  K 4.2 4.2 4.2 4.0  CL 102 102 101 106  CO2 25 24 21* 25  GLUCOSE 117* 111* 113* 102*  BUN 28* 32* 32* 28*  CALCIUM 9.4 8.7* 8.5* 8.8*  CREATININE 1.03 1.16 1.17 1.34*  GFRNONAA >60 >60 >60 51*    LIVER FUNCTION TESTS: Recent Labs    12/26/22 0858 03/18/23 1312  BILITOT 0.3 0.8  AST 20 18  ALT 21 17  ALKPHOS 66 62  PROT 7.3 7.1  ALBUMIN 4.0 3.6    TUMOR MARKERS: No results for  input(s): "AFPTM", "CEA", "CA199", "CHROMGRNA" in the last 8760 hours.  Assessment and Plan:  Benign prostatic hyperplasia with lower urinary tract symptoms including retention; foley catheter dependent: Jack Randolph, 89 year old male, presents today to the [facility] Interventional Radiology department for an image-guided prostate artery embolization.   Risks and benefits of this procedure were discussed with the patient including, but not limited to bleeding, infection, vascular injury or contrast induced renal failure.  This interventional procedure involves the use of X-rays and because of the nature of the planned procedure, it is possible that we will have prolonged use of X-Randolph fluoroscopy.  Potential radiation risks to you include (but are not limited to) the following: - A slightly elevated risk for cancer  several years later in life. This risk is typically less than 0.5% percent. This risk is low in comparison to the normal incidence of human cancer, which is 33% for women and 50% for men according to the American Cancer Society. - Radiation induced injury can include skin redness, resembling a rash, tissue breakdown / ulcers and hair loss (which can be temporary or permanent).   The likelihood of either of these occurring depends on the difficulty of the procedure and whether you are sensitive to radiation due to previous procedures, disease, or genetic conditions.   IF your procedure requires a prolonged use of radiation, you will be notified and given written instructions for further action.  It is your responsibility to monitor the irradiated area for the 2 weeks following the procedure and to notify your physician if you are concerned that you have suffered a radiation induced injury.    All of the patient's questions were answered, patient is agreeable to proceed. He has been NPO. He is a full code.   Consent signed and in chart.   Thank you for this interesting consult.   I greatly enjoyed meeting Jack Randolph and look forward to participating in their care.  A copy of this  report was sent to the requesting provider on this date.  Electronically Signed: Alwyn Ren, AGACNP-BC 11/01/2023, 10:28 AM   I spent a total of  30 Minutes   in face to face in clinical consultation, greater than 50% of which was counseling/coordinating care for benign prostatic hyperplasia.

## 2023-11-01 NOTE — Sedation Documentation (Addendum)
 Marland Kitchen

## 2023-11-01 NOTE — Sedation Documentation (Signed)
32F angioseal deployed

## 2023-11-01 NOTE — Progress Notes (Signed)
 IR team is aware that patient will not accept blood products. Blood refusal form has been signed by the patient and is in his paper chart in IR.   Alwyn Ren, AGACNP-BC 11/01/2023, 12:14 PM

## 2023-11-01 NOTE — Sedation Documentation (Signed)
 Hemostasis 1436. Guaze tegaderm applied. Clean dry intact

## 2023-11-01 NOTE — Procedures (Signed)
 Interventional Radiology Procedure Note  Procedure: Bilateral pelvic angiography  Findings: Please refer to procedural dictation for full description.  Iliac vasculature too tortuous to cannulate patent prostatic arteries bilaterally.  Procedure aborted.  Right CFA 8 Fr Angioseal closure.    Complications: None immediate  Estimated Blood Loss: < 5 mL  Recommendations: 2 hours flat, 2 hours head of bed up to 30 degrees. IR will arrange outpatient follow up to discuss next steps in the next 2-3 weeks.   Marliss Coots, MD

## 2023-11-01 NOTE — Discharge Instructions (Signed)
 Femoral Site Care This sheet gives you information about how to care for yourself after your procedure. Your health care provider may also give you more specific instructions. If you have problems or questions, contact your health care provider. What can I expect after the procedure?  After the procedure, it is common to have: Bruising that usually fades within 1-2 weeks. Tenderness at the site. Follow these instructions at home: Wound care Follow instructions from your health care provider about how to take care of your insertion site. Make sure you: Wash your hands with soap and water before you change your bandage (dressing). If soap and water are not available, use hand sanitizer. Remove your dressing as told by your health care provider. 24 hours Do not take baths, swim, or use a hot tub until your health care provider approves. You may shower 24-48 hours after the procedure or as told by your health care provider. Gently wash the site with plain soap and water. Pat the area dry with a clean towel. Do not rub the site. This may cause bleeding. Do not apply powder or lotion to the site. Keep the site clean and dry. Check your femoral site every day for signs of infection. Check for: Redness, swelling, or pain. Fluid or blood. Warmth. Pus or a bad smell. Activity For the first 2-3 days after your procedure, or as long as directed: Avoid climbing stairs as much as possible. Do not squat. Do not lift anything that is heavier than 10 lb (4.5 kg), or the limit that you are told, until your health care provider says that it is safe. For 5 days Rest as directed. Avoid sitting for a long time without moving. Get up to take short walks every 1-2 hours. Do not drive for 24 hours if you were given a medicine to help you relax (sedative). General instructions Take over-the-counter and prescription medicines only as told by your health care provider. Keep all follow-up visits as told by your  health care provider. This is important. Contact a health care provider if you have: A fever or chills. You have redness, swelling, or pain around your insertion site. Get help right away if: The catheter insertion area swells very fast. You Lwin out. You suddenly start to sweat or your skin gets clammy. The catheter insertion area is bleeding, and the bleeding does not stop when you hold steady pressure on the area. The area near or just beyond the catheter insertion site becomes pale, cool, tingly, or numb. These symptoms may represent a serious problem that is an emergency. Do not wait to see if the symptoms will go away. Get medical help right away. Call your local emergency services (911 in the [place] Do not drive yourself to the hospital. Summary After the procedure, it is common to have bruising that usually fades within 1-2 weeks. Check your femoral site every day for signs of infection. Do not lift anything that is heavier than 10 lb (4.5 kg), or the limit that you are told, until your health care provider says that it is safe. This information is not intended to replace advice given to you by your health care provider. Make sure you discuss any questions you have with your health care provider. Document Revised: 08/21/2017 Document Reviewed: 08/21/2017 Elsevier Patient Education  2020 [employer].

## 2023-11-02 ENCOUNTER — Encounter (HOSPITAL_COMMUNITY): Payer: Self-pay | Admitting: Interventional Radiology

## 2023-11-02 DIAGNOSIS — R633 Feeding difficulties, unspecified: Secondary | ICD-10-CM

## 2023-11-03 DIAGNOSIS — R3914 Feeling of incomplete bladder emptying: Secondary | ICD-10-CM | POA: Diagnosis not present

## 2023-11-23 ENCOUNTER — Other Ambulatory Visit: Payer: Self-pay | Admitting: Family Medicine

## 2023-11-23 DIAGNOSIS — E785 Hyperlipidemia, unspecified: Secondary | ICD-10-CM

## 2023-11-24 ENCOUNTER — Other Ambulatory Visit: Payer: Self-pay | Admitting: Interventional Radiology

## 2023-11-24 DIAGNOSIS — N401 Enlarged prostate with lower urinary tract symptoms: Secondary | ICD-10-CM

## 2023-11-30 ENCOUNTER — Other Ambulatory Visit: Payer: Self-pay | Admitting: Family Medicine

## 2023-11-30 DIAGNOSIS — I10 Essential (primary) hypertension: Secondary | ICD-10-CM

## 2023-12-04 DIAGNOSIS — R339 Retention of urine, unspecified: Secondary | ICD-10-CM | POA: Diagnosis not present

## 2023-12-15 NOTE — Progress Notes (Signed)
 This encounter was conducted via the [employer] providing interactive audio and visual communication.  The patient provided verbal consent to conduct a virtual appointment.  The patient was located at their primary residence during this encounter.  Referring Physician(s): Winter,Christopher Thurston Flow   Chief Complaint: The patient is seen in follow up today s/p attempted prostate artery embolization 11/01/23  History of present illness: Jack Randolph is an 89 y.o. male with history of BPH with urinary retention, urinary tract infections, urinary frequency and elevated PSA. Since June 2024 he has struggled with recurrent UTIs and retention requiring foley catheters. Based on the urology report from 05/08/2023, the patient had a UDS that showed capacious bladder with unstable contractions and an obstructive voiding pattern and high voiding pressures. The PVR following that study was greater than 600 mL.    He was referred to Interventional Radiology for consideration of prostate artery embolization. He met with Dr. Julietta Ogren 09/13/23 and had a foley catheter in place during that consultation. He was tolerating the foley catheter well at that time. He explained that he continues to work and is very active. He had been given options of TURP versus indwelling catheter versus intermittent catheterization versus prostate artery embolization. He was most interested in pursuing prostate artery embolization.   Patient appeared to be a suitable candidate for a prostate artery embolization and the procedure details were discussed in depth. Dr. Julietta Ogren discussed how the procedure was performed with moderate sedation and that these procedures can be technically difficult especially due to his age. They discussed the expectations after the procedure including post-embolization syndrome and eventually removing the foley catheter. The patient was agreeable to proceed and a CTA of the pelvis was completed 09/18/23 to  evaluate his anatomy prior to the procedure.   He presented to the hospital 11/01/23 and I attempted to perform a prostate artery embolization. Unfortunately due to extreme tortuosity of the iliac and prostate arteries the procedure was unable to be performed. He was discharged home following his period of bedrest.    He presents today for follow up via virtual video visit. His symptoms are unchanged (IPSS-QoL 17-6).  Past Medical History:  Diagnosis Date   Hypertension     Past Surgical History:  Procedure Laterality Date   IR ANGIOGRAM FOLLOW UP STUDY  11/01/2023   IR ANGIOGRAM SELECTIVE EACH ADDITIONAL VESSEL  11/01/2023   IR ANGIOGRAM SELECTIVE EACH ADDITIONAL VESSEL  11/01/2023   IR EMBO ARTERIAL NOT HEMORR HEMANG INC GUIDE ROADMAPPING  11/01/2023   IR US  GUIDE VASC ACCESS RIGHT  11/01/2023    Allergies: Statins  Medications: Prior to Admission medications   Medication Sig Start Date End Date Taking? Authorizing Provider  Ascorbic Acid (VITAMIN C) 250 MG CHEW Chew by mouth.    [provider]  bisacodyl (DULCOLAX) 5 MG EC tablet Take 5 mg by mouth daily as needed for moderate constipation.    [provider]  Calcium  Carbonate-Vitamin D (CALCIUM  500 + D PO) Take by mouth.    [provider]  Coenzyme Q10 (COQ10) 100 MG CAPS Take by mouth.    [provider]  cyanocobalamin 100 MCG tablet Take 100 mcg by mouth daily.    [provider]  ezetimibe  (ZETIA ) 10 MG tablet Take 1 tablet by mouth once daily 11/23/23   Tabori, Katherine E, MD  loratadine-pseudoephedrine (CLARITIN-D 24-HOUR) 10-240 MG 24 hr tablet Take 1 tablet by mouth daily.    [provider]  Magnesium 400 MG  TABS Take by mouth.    [provider]  Multiple Vitamins-Minerals (CENTRUM MULTI + OMEGA 3 PO) Take by mouth.    [provider]  Multiple Vitamins-Minerals (MULTIVITAMIN ADULT) CHEW Chew by mouth.    [provider]  phenazopyridine   (PYRIDIUM ) 200 MG tablet Take 1 tablet (200 mg total) by mouth 3 (three) times daily. 03/18/23   Deatra Face, MD  ramipril  (ALTACE ) 10 MG capsule Take 1 capsule by mouth once daily 12/01/23   Tabori, Katherine E, MD  triamcinolone  cream (KENALOG ) 0.1 % APPLY  CREAM EXTERNALLY TWICE DAILY 04/11/23   Jess Morita, MD     Family History  Problem Relation Age of Onset   Healthy Mother    COPD Father     Social History   Socioeconomic History   Marital status: Married    Spouse name: Not on file   Number of children: 5   Years of education: Not on file   Highest education level: Not on file  Occupational History   Occupation: Holiday representative  Tobacco Use   Smoking status: Never   Smokeless tobacco: Never  Vaping Use   Vaping status: Never Used  Substance and Sexual Activity   Alcohol use: Yes   Drug use: No   Sexual activity: Not Currently  Other Topics Concern   Not on file  Social History Narrative   Not on file   Social Drivers of Health   Financial Resource Strain: Low Risk  (03/23/2023)   Overall Financial Resource Strain (CARDIA)    Difficulty of Paying Living Expenses: Not hard at all  Food Insecurity: No Food Insecurity (03/23/2023)   Hunger Vital Sign    Worried About Running Out of Food in the Last Year: Never true    Ran Out of Food in the Last Year: Never true  Transportation Needs: No Transportation Needs (03/23/2023)   PRAPARE - Administrator, Civil Service (Medical): No    Lack of Transportation (Non-Medical): No  Physical Activity: Inactive (03/23/2023)   Exercise Vital Sign    Days of Exercise per Week: 0 days    Minutes of Exercise per Session: 0 min  Stress: No Stress Concern Present (03/23/2023)   [employer] of Occupational Health - Occupational Stress Questionnaire    Feeling of Stress : Not at all  Social Connections: Moderately Integrated (03/23/2023)   Social Connection and Isolation Panel [NHANES]    Frequency of Communication  with Friends and Family: More than three times a week    Frequency of Social Gatherings with Friends and Family: Twice a week    Attends Religious Services: More than 4 times per year    Active Member of [employer] or Organizations: No    Attends Banker Meetings: Never    Marital Status: Married     Vital Signs: There were no vitals taken for this visit.  Physical Exam Patient is alert, oriented and able to participate fully in the conversation. No apparent discomfort or distress observed. Appears appropriately dressed.     Imaging: Pelvic angio 11/01/23   Labs:  CBC: Recent Labs    02/28/23 0058 03/18/23 1312 05/16/23 0548 11/01/23 0920  WBC 7.1 8.1 9.3 5.7  HGB 14.7 13.5 12.6* 14.5  HCT 44.8 41.6 39.0 43.2  PLT 199 222 172 196    COAGS: Recent Labs    11/01/23 0920  INR 1.0    BMP: Recent Labs    02/28/23 0058 03/18/23 1312 05/16/23 0548  11/01/23 0920  NA 138 136 133* 138  K 4.2 4.2 4.2 4.0  CL 102 102 101 106  CO2 25 24 21* 25  GLUCOSE 117* 111* 113* 102*  BUN 28* 32* 32* 28*  CALCIUM  9.4 8.7* 8.5* 8.8*  CREATININE 1.03 1.16 1.17 1.34*  GFRNONAA >60 >60 >60 51*    LIVER FUNCTION TESTS: Recent Labs    12/26/22 0858 03/18/23 1312  BILITOT 0.3 0.8  AST 20 18  ALT 21 17  ALKPHOS 66 62  PROT 7.3 7.1  ALBUMIN 4.0 3.6    Assessment and Plan: 89 year old male with a history of benign prostatic hyperplasia with lower urinary tract symptoms requiring continuous catheterization status post attempted prostate artery embolization 11/01/23.  The procedure was unsuccessful due to inability to cannulate the prostatic arteries bilaterally secondary to severe tortuosity of the iliac and anterior division arteries.  We discussed repeat attempt today via left radial artery approach.  We specifically discussed risk of iatrogenic stroke associated with left radial approach in a octogenarian.  He would like to proceed.  Plan for prostate artery  embolization via left radial artery approach at [facility] with moderate sedation.  Creasie Doctor, MD Pager: [phone]    I spent a total of 25 Minutes in virtual video clinical consultation, greater than 50% of which was counseling/coordinating care for benign prostatic hyperplasia.

## 2023-12-18 ENCOUNTER — Ambulatory Visit
Admission: RE | Admit: 2023-12-18 | Discharge: 2023-12-18 | Disposition: A | Discharge status: Home / Self Care | Source: Ambulatory Visit | Attending: Interventional Radiology | Admitting: Interventional Radiology

## 2023-12-18 DIAGNOSIS — N401 Enlarged prostate with lower urinary tract symptoms: Secondary | ICD-10-CM | POA: Diagnosis not present

## 2023-12-18 DIAGNOSIS — K766 Portal hypertension: Secondary | ICD-10-CM | POA: Diagnosis not present

## 2023-12-18 DIAGNOSIS — R339 Retention of urine, unspecified: Secondary | ICD-10-CM | POA: Diagnosis not present

## 2023-12-18 HISTORY — PX: IR RADIOLOGIST EVAL & MGMT: IMG5224

## 2023-12-26 ENCOUNTER — Telehealth (HOSPITAL_COMMUNITY): Payer: Self-pay | Admitting: Interventional Radiology

## 2023-12-26 NOTE — Telephone Encounter (Signed)
 Called pt to schedule his second attempt at a PAE with Dr. Jinx Mourning. Left VM for him to call me back. JM

## 2023-12-27 ENCOUNTER — Other Ambulatory Visit (HOSPITAL_COMMUNITY): Payer: Self-pay | Admitting: Interventional Radiology

## 2023-12-27 DIAGNOSIS — N138 Other obstructive and reflux uropathy: Secondary | ICD-10-CM

## 2024-01-03 DIAGNOSIS — R3914 Feeling of incomplete bladder emptying: Secondary | ICD-10-CM | POA: Diagnosis not present

## 2024-01-03 DIAGNOSIS — R339 Retention of urine, unspecified: Secondary | ICD-10-CM | POA: Diagnosis not present

## 2024-01-17 ENCOUNTER — Other Ambulatory Visit: Payer: Self-pay | Admitting: Radiology

## 2024-01-17 ENCOUNTER — Telehealth (HOSPITAL_COMMUNITY): Payer: Self-pay | Admitting: Student

## 2024-01-17 ENCOUNTER — Other Ambulatory Visit (HOSPITAL_COMMUNITY): Payer: Self-pay | Admitting: Student

## 2024-01-17 DIAGNOSIS — N401 Enlarged prostate with lower urinary tract symptoms: Secondary | ICD-10-CM

## 2024-01-17 MED ORDER — PHENAZOPYRIDINE HCL 100 MG PO TABS: 100.0000 mg | ORAL_TABLET | Freq: Three times a day (TID) | ORAL | 0 refills | Status: AC

## 2024-01-17 MED ORDER — CIPROFLOXACIN HCL 500 MG PO TABS
500.0000 mg | ORAL_TABLET | Freq: Two times a day (BID) | ORAL | 0 refills | Status: AC
Start: 2024-01-17 — End: 2024-01-24

## 2024-01-17 MED ORDER — SOLIFENACIN SUCCINATE 5 MG PO TABS: 5.0000 mg | ORAL_TABLET | Freq: Every day | ORAL | 0 refills | Status: AC

## 2024-01-17 NOTE — Telephone Encounter (Signed)
 Patient scheduled 01/18/24 for repeat prostate artery embolization with Dr. Jinx Mourning. Post-procedure medications e-prescribed to his [employer]. Patient aware to pick them up today if possible. We discussed tomorrow's procedure and he is aware of the time he needs to arrive to [facility]. Patient has my number if he has additional questions/concerns.  Jetta Morrow, AGACNP-BC 01/17/2024, 10:11 AM

## 2024-01-18 ENCOUNTER — Other Ambulatory Visit (HOSPITAL_COMMUNITY): Payer: Self-pay | Admitting: Interventional Radiology

## 2024-01-18 ENCOUNTER — Ambulatory Visit (HOSPITAL_COMMUNITY)
Admission: RE | Admit: 2024-01-18 | Discharge: 2024-01-18 | Disposition: A | Discharge status: Home / Self Care | Source: Ambulatory Visit | Attending: Interventional Radiology | Admitting: Interventional Radiology

## 2024-01-18 ENCOUNTER — Other Ambulatory Visit: Payer: Self-pay

## 2024-01-18 DIAGNOSIS — N401 Enlarged prostate with lower urinary tract symptoms: Secondary | ICD-10-CM | POA: Insufficient documentation

## 2024-01-18 DIAGNOSIS — R350 Frequency of micturition: Secondary | ICD-10-CM | POA: Insufficient documentation

## 2024-01-18 DIAGNOSIS — Z87440 Personal history of urinary (tract) infections: Secondary | ICD-10-CM | POA: Insufficient documentation

## 2024-01-18 DIAGNOSIS — R339 Retention of urine, unspecified: Secondary | ICD-10-CM | POA: Insufficient documentation

## 2024-01-18 HISTORY — PX: IR US GUIDE VASC ACCESS LEFT: IMG2389

## 2024-01-18 HISTORY — PX: IR ANGIOGRAM PELVIS SELECTIVE OR SUPRASELECTIVE: IMG661

## 2024-01-18 HISTORY — PX: IR EMBO ARTERIAL NOT HEMORR HEMANG INC GUIDE ROADMAPPING: IMG5448

## 2024-01-18 HISTORY — PX: IR EMBO TUMOR ORGAN ISCHEMIA INFARCT INC GUIDE ROADMAPPING: IMG5449

## 2024-01-18 HISTORY — PX: IR ANGIOGRAM SELECTIVE EACH ADDITIONAL VESSEL: IMG667

## 2024-01-18 LAB — BASIC METABOLIC PANEL WITH GFR
Anion gap: 5 (ref 5–15)
BUN: 33 mg/dL — ABNORMAL HIGH (ref 8–23)
CO2: 25 mmol/L (ref 22–32)
Calcium: 9.2 mg/dL (ref 8.9–10.3)
Chloride: 107 mmol/L (ref 98–111)
Creatinine, Ser: 1.12 mg/dL (ref 0.61–1.24)
GFR, Estimated: >60 mL/min (ref >60)
Glucose, Bld: 89 mg/dL (ref 70–99)
Potassium: 4.5 mmol/L (ref 3.5–5.1)
Sodium: 137 mmol/L (ref 135–145)

## 2024-01-18 LAB — PROTIME-INR
INR: 1.1 (ref 0.8–1.2)
Prothrombin Time: 14 s (ref 11.4–15.2)

## 2024-01-18 LAB — CBC
HCT: 45 % (ref 39.0–52.0)
Hemoglobin: 14.6 g/dL (ref 13.0–17.0)
MCH: 29.2 pg (ref 26.0–34.0)
MCHC: 32.4 g/dL (ref 30.0–36.0)
MCV: 90 fL (ref 80.0–100.0)
Platelets: 221 10*3/uL (ref 150–400)
RBC: 5 MIL/uL (ref 4.22–5.81)
RDW: 13.6 % (ref 11.5–15.5)
WBC: 6.2 10*3/uL (ref 4.0–10.5)
nRBC: 0 % (ref 0.0–0.2)

## 2024-01-18 LAB — NO BLOOD PRODUCTS

## 2024-01-18 MED ORDER — PREDNISONE 20 MG PO TABS
20.0000 mg | ORAL_TABLET | Freq: Once | ORAL | Status: AC
  Administered 2024-01-18: 20 mg via ORAL
  Filled 2024-01-18: qty 1

## 2024-01-18 MED ORDER — MIDAZOLAM HCL 2 MG/2ML IJ SOLN
INTRAMUSCULAR | Status: AC
Start: 2024-01-18 — End: ?
  Filled 2024-01-18: qty 2

## 2024-01-18 MED ORDER — IODIXANOL 320 MG/ML IV SOLN
100.0000 mL | Freq: Once | INTRAVENOUS | Status: AC | PRN
  Administered 2024-01-18: 30 mL via INTRA_ARTERIAL

## 2024-01-18 MED ORDER — CIPROFLOXACIN IN D5W 400 MG/200ML IV SOLN: 400.0000 mg | INTRAVENOUS | Status: DC

## 2024-01-18 MED ORDER — LIDOCAINE-EPINEPHRINE 1 %-1:100000 IJ SOLN
10.0000 mL | Freq: Once | INTRAMUSCULAR | Status: AC
  Administered 2024-01-18: 5 mL

## 2024-01-18 MED ORDER — FENTANYL CITRATE (PF) 100 MCG/2ML IJ SOLN
INTRAMUSCULAR | Status: AC
  Filled 2024-01-18: qty 2

## 2024-01-18 MED ORDER — CIPROFLOXACIN IN D5W 400 MG/200ML IV SOLN
INTRAVENOUS | Status: AC | PRN
  Administered 2024-01-18: 400 mg via INTRAVENOUS

## 2024-01-18 MED ORDER — FENTANYL CITRATE (PF) 100 MCG/2ML IJ SOLN
INTRAMUSCULAR | Status: AC | PRN
  Administered 2024-01-18 (×3): 25 ug via INTRAVENOUS
  Administered 2024-01-18: 50 ug via INTRAVENOUS
  Administered 2024-01-18 (×2): 25 ug via INTRAVENOUS

## 2024-01-18 MED ORDER — CIPROFLOXACIN IN D5W 400 MG/200ML IV SOLN
INTRAVENOUS | Status: AC
  Filled 2024-01-18: qty 200

## 2024-01-18 MED ORDER — LIDOCAINE-PRILOCAINE 2.5-2.5 % EX CREA
TOPICAL_CREAM | Freq: Once | CUTANEOUS | Status: AC
  Filled 2024-01-18: qty 5

## 2024-01-18 MED ORDER — HEPARIN SODIUM (PORCINE) 1000 UNIT/ML IJ SOLN
INTRAMUSCULAR | Status: AC
Start: 2024-01-18 — End: ?
  Filled 2024-01-18: qty 10

## 2024-01-18 MED ORDER — IOHEXOL 300 MG/ML  SOLN
150.0000 mL | Freq: Once | INTRAMUSCULAR | Status: AC | PRN
  Administered 2024-01-18: 70 mL via INTRA_ARTERIAL

## 2024-01-18 MED ORDER — LIDOCAINE-EPINEPHRINE 1 %-1:100000 IJ SOLN
INTRAMUSCULAR | Status: AC
  Filled 2024-01-18: qty 1

## 2024-01-18 MED ORDER — MIDAZOLAM HCL 2 MG/2ML IJ SOLN
INTRAMUSCULAR | Status: AC
  Filled 2024-01-18: qty 2

## 2024-01-18 MED ORDER — VERAPAMIL HCL 2.5 MG/ML IV SOLN
INTRAVENOUS | Status: AC
  Filled 2024-01-18: qty 2

## 2024-01-18 MED ORDER — SODIUM CHLORIDE (PF) 0.9 % IJ SOLN
INTRAVENOUS | Status: AC | PRN
  Administered 2024-01-18 (×2): 100 ug via INTRA_ARTERIAL

## 2024-01-18 MED ORDER — NITROGLYCERIN 1 MG/10 ML FOR IR/CATH LAB
INTRA_ARTERIAL | Status: AC
  Filled 2024-01-18: qty 10

## 2024-01-18 MED ORDER — VERAPAMIL HCL 2.5 MG/ML IV SOLN: INTRA_ARTERIAL | Status: AC | PRN

## 2024-01-18 MED ORDER — NITROGLYCERIN 2 % TD OINT
1.0000 [in_us] | TOPICAL_OINTMENT | Freq: Once | TRANSDERMAL | Status: AC
  Administered 2024-01-18: 1 [in_us] via TOPICAL
  Filled 2024-01-18: qty 1

## 2024-01-18 MED ORDER — MIDAZOLAM HCL 2 MG/2ML IJ SOLN
INTRAMUSCULAR | Status: AC | PRN
  Administered 2024-01-18 (×2): .5 mg via INTRAVENOUS
  Administered 2024-01-18: 1 mg via INTRAVENOUS
  Administered 2024-01-18 (×3): .5 mg via INTRAVENOUS

## 2024-01-18 MED ORDER — IOHEXOL 300 MG/ML  SOLN: 100.0000 mL | Freq: Once | INTRAMUSCULAR | Status: DC | PRN

## 2024-01-18 NOTE — H&P (Signed)
 Chief Complaint: Patient was seen in consultation today for BPH w/ LUTS with urinary obstruction, with repeat consideration for prostatic artery embolization.  Referring Provider(s): Dr. Yevonne Heman, MD   Supervising Physician: Creasie Doctor  Patient Status: [facility] - Out-pt  Patient is Full Code  History of Present Illness: Jack Randolph is a 89 y.o. male  with PMHx notable for HTN, BPH w/ LUTS with urinary retention requiring chronic indwelling foley catheter, UTI, urinary frequency, and elevated PSA. Patient is know to IR service.   Per Dr. Aleen Huron progress note on 4/28: "[...] Since June 2024 [patient] has struggled with recurrent UTIs and retention requiring foley catheters. Based on the urology report from 05/08/2023, the patient had a UDS that showed capacious bladder with unstable contractions and an obstructive voiding pattern and high voiding pressures. The PVR following that study was greater than 600 mL.    He was referred to Interventional Radiology for consideration of prostate artery embolization. He met with Dr. Julietta Ogren 09/13/23 and had a foley catheter in place during that consultation. He was tolerating the foley catheter well at that time. He explained that he continues to work and is very active. He had been given options of TURP versus indwelling catheter versus intermittent catheterization versus prostate artery embolization. He was most interested in pursuing prostate artery embolization.   Patient appeared to be a suitable candidate for a prostate artery embolization and the procedure details were discussed in depth. Dr. Julietta Ogren discussed how the procedure was performed with moderate sedation and that these procedures can be technically difficult especially due to his age. They discussed the expectations after the procedure including post-embolization syndrome and eventually removing the foley catheter. The patient was agreeable to proceed and a CTA of the  pelvis was completed 09/18/23 to evaluate his anatomy prior to the procedure.    He presented to the hospital 11/01/23 and I attempted to perform a prostate artery embolization. Unfortunately due to extreme tortuosity of the iliac and prostate arteries the procedure was unable to be performed. He was discharged home following his period of bedrest."  Patient is scheduled for repeat attempt at PAE in IR today.   Patient is alert and laying in bed, calm.  Patient is currently without any significant complaints.  Patient denies any fevers, headache, chest pain, SOB, cough, abdominal pain, nausea, vomiting or bleeding.     Past Medical History:  Diagnosis Date   Hypertension     Past Surgical History:  Procedure Laterality Date   IR ANGIOGRAM FOLLOW UP STUDY  11/01/2023   IR ANGIOGRAM SELECTIVE EACH ADDITIONAL VESSEL  11/01/2023   IR ANGIOGRAM SELECTIVE EACH ADDITIONAL VESSEL  11/01/2023   IR EMBO ARTERIAL NOT HEMORR HEMANG INC GUIDE ROADMAPPING  11/01/2023   IR RADIOLOGIST EVAL & MGMT  12/18/2023   IR US  GUIDE VASC ACCESS RIGHT  11/01/2023    Allergies: Statins  Medications: Prior to Admission medications   Medication Sig Start Date End Date Taking? Authorizing Provider  Ascorbic Acid (VITAMIN C) 250 MG CHEW Chew by mouth.    [provider]  bisacodyl (DULCOLAX) 5 MG EC tablet Take 5 mg by mouth daily as needed for moderate constipation.    [provider]  Calcium  Carbonate-Vitamin D (CALCIUM  500 + D PO) Take by mouth.    [provider]  ciprofloxacin  (CIPRO ) 500 MG tablet Take 1 tablet (500 mg total) by mouth 2 (two) times daily for 7 days. 01/17/24 01/24/24  Covington, Jamie  R, NP  Coenzyme Q10 (COQ10) 100 MG CAPS Take by mouth.    [provider]  cyanocobalamin 100 MCG tablet Take 100 mcg by mouth daily.    [provider]  ezetimibe  (ZETIA ) 10 MG tablet Take 1 tablet by mouth once daily 11/23/23   Tabori, Katherine E, MD   loratadine-pseudoephedrine (CLARITIN-D 24-HOUR) 10-240 MG 24 hr tablet Take 1 tablet by mouth daily.    [provider]  Magnesium 400 MG TABS Take by mouth.    [provider]  Multiple Vitamins-Minerals (CENTRUM MULTI + OMEGA 3 PO) Take by mouth.    [provider]  Multiple Vitamins-Minerals (MULTIVITAMIN ADULT) CHEW Chew by mouth.    [provider]  phenazopyridine  (PYRIDIUM ) 100 MG tablet Take 1 tablet (100 mg total) by mouth in the morning, at noon, and at bedtime for 7 days. 01/17/24 01/24/24  Covington, Jamie R, NP  phenazopyridine  (PYRIDIUM ) 200 MG tablet Take 1 tablet (200 mg total) by mouth 3 (three) times daily. 03/18/23   Deatra Face, MD  ramipril  (ALTACE ) 10 MG capsule Take 1 capsule by mouth once daily 12/01/23   Tabori, Katherine E, MD  solifenacin  (VESICARE ) 5 MG tablet Take 1 tablet (5 mg total) by mouth daily for 7 days. 01/17/24 01/24/24  Fawn Hooks, NP  triamcinolone  cream (KENALOG ) 0.1 % APPLY  CREAM EXTERNALLY TWICE DAILY 04/11/23   Jess Morita, MD     Family History  Problem Relation Age of Onset   Healthy Mother    COPD Father     Social History   Socioeconomic History   Marital status: Married    Spouse name: Not on file   Number of children: 5   Years of education: Not on file   Highest education level: Not on file  Occupational History   Occupation: Holiday representative  Tobacco Use   Smoking status: Never   Smokeless tobacco: Never  Vaping Use   Vaping status: Never Used  Substance and Sexual Activity   Alcohol use: Yes   Drug use: No   Sexual activity: Not Currently  Other Topics Concern   Not on file  Social History Narrative   Not on file   Social Drivers of Health   Financial Resource Strain: Low Risk  (03/23/2023)   Overall Financial Resource Strain (CARDIA)    Difficulty of Paying Living Expenses: Not hard at all  Food Insecurity: No Food Insecurity (03/23/2023)   Hunger Vital Sign    Worried  About Running Out of Food in the Last Year: Never true    Ran Out of Food in the Last Year: Never true  Transportation Needs: No Transportation Needs (03/23/2023)   PRAPARE - Administrator, Civil Service (Medical): No    Lack of Transportation (Non-Medical): No  Physical Activity: Inactive (03/23/2023)   Exercise Vital Sign    Days of Exercise per Week: 0 days    Minutes of Exercise per Session: 0 min  Stress: No Stress Concern Present (03/23/2023)   [employer] of Occupational Health - Occupational Stress Questionnaire    Feeling of Stress : Not at all  Social Connections: Moderately Integrated (03/23/2023)   Social Connection and Isolation Panel [NHANES]    Frequency of Communication with Friends and Family: More than three times a week    Frequency of Social Gatherings with Friends and Family: Twice a week    Attends Religious Services: More than 4 times per year    Active Member  of Clubs or Organizations: No    Attends Banker Meetings: Never    Marital Status: Married     Review of Systems: A 12 point ROS discussed and pertinent positives are indicated in the HPI above.  All other systems are negative.  Vital Signs: BP (!) 156/93   Pulse 78   Temp 97.8 F (36.6 C) (Oral)   Resp 17   Ht 5\' 11"  (1.803 m)   Wt 204 lb (92.5 kg)   SpO2 98%   BMI 28.45 kg/m   Advance Care Plan: The advanced care place/surrogate decision maker was discussed at the time of visit and the patient did not wish to discuss or was not able to name a surrogate decision maker or provide an advance care plan.  Physical Exam Vitals reviewed.  Constitutional:      General: He is not in acute distress.    Appearance: Normal appearance.  HENT:     Mouth/Throat:     Mouth: Mucous membranes are dry.  Cardiovascular:     Rate and Rhythm: Normal rate and regular rhythm.     Pulses: Normal pulses.  Pulmonary:     Effort: Pulmonary effort is normal.     Breath sounds: Normal  breath sounds.  Abdominal:     General: Abdomen is flat.     Palpations: Abdomen is soft.     Tenderness: There is no abdominal tenderness.  Musculoskeletal:        General: Normal range of motion.     Cervical back: Normal range of motion.  Skin:    General: Skin is warm and dry.  Neurological:     Mental Status: He is alert and oriented to person, place, and time.  Psychiatric:        Mood and Affect: Mood normal.        Behavior: Behavior normal.        Thought Content: Thought content normal.        Judgment: Judgment normal.     Imaging: No results found.  Labs:  CBC: Recent Labs    03/18/23 1312 05/16/23 0548 11/01/23 0920 01/18/24 0706  WBC 8.1 9.3 5.7 6.2  HGB 13.5 12.6* 14.5 14.6  HCT 41.6 39.0 43.2 45.0  PLT 222 172 196 221    COAGS: Recent Labs    11/01/23 0920 01/18/24 0706  INR 1.0 1.1    BMP: Recent Labs    02/28/23 0058 03/18/23 1312 05/16/23 0548 11/01/23 0920  NA 138 136 133* 138  K 4.2 4.2 4.2 4.0  CL 102 102 101 106  CO2 25 24 21* 25  GLUCOSE 117* 111* 113* 102*  BUN 28* 32* 32* 28*  CALCIUM  9.4 8.7* 8.5* 8.8*  CREATININE 1.03 1.16 1.17 1.34*  GFRNONAA >60 >60 >60 51*    LIVER FUNCTION TESTS: Recent Labs    03/18/23 1312  BILITOT 0.8  AST 18  ALT 17  ALKPHOS 62  PROT 7.1  ALBUMIN 3.6    TUMOR MARKERS: No results for input(s): "AFPTM", "CEA", "CA199", "CHROMGRNA" in the last 8760 hours.  Assessment and Plan: Per Dr. Aleen Huron progress note on 4/28: "89 year old male with a history of benign prostatic hyperplasia with lower urinary tract symptoms requiring continuous catheterization status post attempted prostate artery embolization 11/01/23.  The procedure was unsuccessful due to inability to cannulate the prostatic arteries bilaterally secondary to severe tortuosity of the iliac and anterior division arteries.  We discussed repeat attempt [...] via left radial artery  approach.  We specifically discussed risk of  iatrogenic stroke associated with left radial approach in a octogenarian.  He would like to proceed.   Plan for prostate artery embolization via left radial artery approach at [facility] with moderate sedation."  Patient presents for scheduled prostate artery embolization in IR today.  Patient has been NPO since midnight.  All labs and medications are within acceptable parameters.  No pertinent allergies.    The risks and benefits of embolization were discussed with the patient including, but not limited to bleeding, infection, vascular injury, post operative pain, or contrast induced renal failure.  This procedure involves the use of X-rays and because of the nature of the planned procedure, it is possible that we will have prolonged use of X-ray fluoroscopy.  Potential radiation risks to you include (but are not limited to) the following: - A slightly elevated risk for cancer several years later in life. This risk is typically less than 0.5% percent. This risk is low in comparison to the normal incidence of human cancer, which is 33% for women and 50% for men according to the [employer] Society. - Radiation induced injury can include skin redness, resembling a rash, tissue breakdown / ulcers and hair loss (which can be temporary or permanent).   The likelihood of either of these occurring depends on the difficulty of the procedure and whether you are sensitive to radiation due to previous procedures, disease, or genetic conditions.   IF your procedure requires a prolonged use of radiation, you will be notified and given written instructions for further action.  It is your responsibility to monitor the irradiated area for the 2 weeks following the procedure and to notify your physician if you are concerned that you have suffered a radiation induced injury.    All of the patient's questions were answered, patient is agreeable to proceed. Consent signed and in chart.       Thank you for allowing our service to participate in Zadin Lange 's care.  Electronically Signed: Lovena Rubinstein, PA-C   01/18/2024, 8:33 AM      I spent a total of 25 Minutes in face to face in clinical consultation, greater than 50% of which was counseling/coordinating care for BPH w/ LUTS with urinary obstruction, with repeat consideration for prostatic artery embolization.

## 2024-01-18 NOTE — Procedures (Signed)
 Interventional Radiology Procedure Note  Procedure: Prostate artery embolization   Findings: Please refer to procedural dictation for full description. Left radial access.  TR band applied at 11:55 with 10 cc air  Complications: None immediate  Estimated Blood Loss: < 5 ml  Recommendations: Keep Foley catheter, will follow up with Urology for voiding trial in the coming weeks. IR will arrange 1 month outpatient follow up.   Creasie Doctor, MD

## 2024-01-18 NOTE — Sedation Documentation (Addendum)
 Patient transported to recovery area via stretcher. Bedside report given to RN. Radial site assessed - Level 0, no hematoma, dressing is clean, dry, and intact. Pulses also assessed bilaterally.

## 2024-01-20 ENCOUNTER — Encounter (HOSPITAL_COMMUNITY): Payer: Self-pay

## 2024-01-20 ENCOUNTER — Emergency Department (HOSPITAL_COMMUNITY)
Admission: EM | Admit: 2024-01-20 | Discharge: 2024-01-20 | Disposition: A | Discharge status: Home / Self Care | Attending: Emergency Medicine | Admitting: Emergency Medicine

## 2024-01-20 ENCOUNTER — Other Ambulatory Visit: Payer: Self-pay

## 2024-01-20 DIAGNOSIS — R339 Retention of urine, unspecified: Secondary | ICD-10-CM | POA: Diagnosis not present

## 2024-01-20 DIAGNOSIS — R300 Dysuria: Secondary | ICD-10-CM | POA: Diagnosis not present

## 2024-01-20 LAB — CBC WITH DIFFERENTIAL/PLATELET
Abs Immature Granulocytes: 0.05 10*3/uL (ref 0.00–0.07)
Basophils Absolute: 0 10*3/uL (ref 0.0–0.1)
Basophils Relative: 0 %
Eosinophils Absolute: 0.1 10*3/uL (ref 0.0–0.5)
Eosinophils Relative: 1 %
HCT: 41.4 % (ref 39.0–52.0)
Hemoglobin: 13.7 g/dL (ref 13.0–17.0)
Immature Granulocytes: 1 %
Lymphocytes Relative: 13 %
Lymphs Abs: 1.4 10*3/uL (ref 0.7–4.0)
MCH: 29.5 pg (ref 26.0–34.0)
MCHC: 33.1 g/dL (ref 30.0–36.0)
MCV: 89.2 fL (ref 80.0–100.0)
Monocytes Absolute: 1 10*3/uL (ref 0.1–1.0)
Monocytes Relative: 10 %
Neutro Abs: 7.7 10*3/uL (ref 1.7–7.7)
Neutrophils Relative %: 75 %
Platelets: 167 10*3/uL (ref 150–400)
RBC: 4.64 MIL/uL (ref 4.22–5.81)
RDW: 13.5 % (ref 11.5–15.5)
WBC: 10.2 10*3/uL (ref 4.0–10.5)
nRBC: 0 % (ref 0.0–0.2)

## 2024-01-20 LAB — BASIC METABOLIC PANEL WITH GFR
Anion gap: 11 (ref 5–15)
BUN: 25 mg/dL — ABNORMAL HIGH (ref 8–23)
CO2: 21 mmol/L — ABNORMAL LOW (ref 22–32)
Calcium: 8.5 mg/dL — ABNORMAL LOW (ref 8.9–10.3)
Chloride: 101 mmol/L (ref 98–111)
Creatinine, Ser: 1.18 mg/dL (ref 0.61–1.24)
GFR, Estimated: 59 mL/min — ABNORMAL LOW (ref >60)
Glucose, Bld: 122 mg/dL — ABNORMAL HIGH (ref 70–99)
Potassium: 4.3 mmol/L (ref 3.5–5.1)
Sodium: 133 mmol/L — ABNORMAL LOW (ref 135–145)

## 2024-01-20 LAB — URINALYSIS, ROUTINE W REFLEX MICROSCOPIC
Bilirubin Urine: NEGATIVE
Glucose, UA: NEGATIVE mg/dL
Hgb urine dipstick: NEGATIVE
Ketones, ur: NEGATIVE mg/dL
Nitrite: NEGATIVE
Protein, ur: 30 mg/dL — AB
Specific Gravity, Urine: 1.025 (ref 1.005–1.030)
pH: 6 (ref 5.0–8.0)

## 2024-01-20 NOTE — ED Notes (Addendum)
 Patient arrived to the ED with foley in place; this foley was placed 01/18/2024, where it was placed by IR.

## 2024-01-20 NOTE — ED Notes (Signed)
 Called lab will add culture

## 2024-01-20 NOTE — Progress Notes (Signed)
 Supervising Physician: Elene Griffes  Patient Status:  [facility] - ED   Chief Complaint: Dysuria HPI: Patient s/p prostate artery embolization 01/18/24 with Dr. Jinx Mourning. Patient now in the ED with dysuria and concern for UTI.   Subjective: Patient resting comfortably in bed. He is awake/alert/oriented and denies any current pain or discomfort.   Allergies: Statins  Medications: Prior to Admission medications   Medication Sig Start Date End Date Taking? Authorizing Provider  Ascorbic Acid (VITAMIN C) 250 MG CHEW Chew by mouth.    [provider]  bisacodyl (DULCOLAX) 5 MG EC tablet Take 5 mg by mouth daily as needed for moderate constipation.    [provider]  Calcium  Carbonate-Vitamin D (CALCIUM  500 + D PO) Take by mouth.    [provider]  ciprofloxacin  (CIPRO ) 500 MG tablet Take 1 tablet (500 mg total) by mouth 2 (two) times daily for 7 days. 01/17/24 01/24/24  Jaryah Aracena R, NP  Coenzyme Q10 (COQ10) 100 MG CAPS Take by mouth.    [provider]  cyanocobalamin 100 MCG tablet Take 100 mcg by mouth daily.    [provider]  ezetimibe  (ZETIA ) 10 MG tablet Take 1 tablet by mouth once daily 11/23/23   Tabori, Katherine E, MD  loratadine-pseudoephedrine (CLARITIN-D 24-HOUR) 10-240 MG 24 hr tablet Take 1 tablet by mouth daily.    [provider]  Magnesium 400 MG TABS Take by mouth.    [provider]  Multiple Vitamins-Minerals (CENTRUM MULTI + OMEGA 3 PO) Take by mouth.    [provider]  Multiple Vitamins-Minerals (MULTIVITAMIN ADULT) CHEW Chew by mouth.    [provider]  phenazopyridine  (PYRIDIUM ) 100 MG tablet Take 1 tablet (100 mg total) by mouth in the morning, at noon, and at bedtime for 7 days. 01/17/24 01/24/24  Masen Luallen R, NP  phenazopyridine  (PYRIDIUM ) 200 MG tablet Take 1 tablet (200 mg total) by mouth 3 (three) times daily. 03/18/23   Deatra Face, MD  ramipril  (ALTACE ) 10 MG capsule Take 1  capsule by mouth once daily 12/01/23   Tabori, Katherine E, MD  solifenacin  (VESICARE ) 5 MG tablet Take 1 tablet (5 mg total) by mouth daily for 7 days. 01/17/24 01/24/24  Ysabella Babiarz R, NP  triamcinolone  cream (KENALOG ) 0.1 % APPLY  CREAM EXTERNALLY TWICE DAILY 04/11/23   Jess Morita, MD     Vital Signs: BP (!) 142/100 (BP Location: Right Arm)   Pulse 68   Temp 98.1 F (36.7 C) (Oral)   Resp 18   SpO2 95%   Physical Exam Constitutional:      General: He is not in acute distress.    Appearance: He is not ill-appearing.  Pulmonary:     Effort: Pulmonary effort is normal.  Abdominal:     Tenderness: There is no abdominal tenderness.  Genitourinary:    Comments: Foley catheter in place. Approximately 700 ml of clear yellow urine in gravity bag. Gravity bag emptied. Urine is not malodorous.  Skin:    General: Skin is warm and dry.  Neurological:     Mental Status: He is alert and oriented to person, place, and time.     Imaging: IR EMBO ARTERIAL NOT HEMORR HEMANG INC GUIDE ROADMAPPING Result Date: 01/19/2024 INDICATION: 89 year old male with a history of benign prostatic hyperplasia with lower urinary tract symptoms requiring continuous catheterization status post attempted prostate artery embolization 11/01/23. The procedure was unsuccessful due to inability to cannulate the prostatic arteries bilaterally secondary  to severe tortuosity of the iliac and anterior division arteries. He presents for repeat attempt today via left radial artery approach EXAM: 1. Ultrasound-guided vascular access of the left radial artery. 2. Selective catheterization and angiography of the left common iliac, left internal iliac, left prostatic, right common iliac, right internal iliac, and right prostatic arteries. 3. Bilateral prostate artery embolization. MEDICATIONS: Ciprofloxacin  400 mg IV. The antibiotic was administered within 1 hour of the procedure ANESTHESIA/SEDATION: Moderate (conscious)  sedation was employed during this procedure. A total of Versed  3.5 mg and Fentanyl  175 mcg was administered intravenously. Moderate Sedation Time: 175 minutes. The patient's level of consciousness and vital signs were monitored continuously by radiology nursing throughout the procedure under my direct supervision. CONTRAST:  70mL OMNIPAQUE  IOHEXOL  300 MG/ML SOLN, 30mL VISIPAQUE  IODIXANOL  320 MG/ML IV SOLN FLUOROSCOPY: Radiation Exposure Index (as provided by the fluoroscopic device): 3,770 mGy reference air Kerma COMPLICATIONS: None immediate. PROCEDURE: Informed consent was obtained from the patient following explanation of the procedure, risks, benefits and alternatives. The patient understands, agrees and consents for the procedure. All questions were addressed. A time out was performed prior to the initiation of the procedure. Maximal barrier sterile technique utilized including caps, mask, sterile gowns, sterile gloves, large sterile drape, hand hygiene, and Betadine prep. The left wrist was prepped and draped in standard fashion. Pulse oximeter was attached to the left thumb. Art Largo test was performed, grade A. The left radial artery measured 0.39 cm in diameter. Subdermal Local anesthesia was provided at the planned needle entry site with 1% lidocaine . A small skin nick was made. Under direct ultrasound visualization, the left radial artery was punctured with a 21 gauge micropuncture needle. A permanent image was captured and stored in the record. A microwire was placed and exchanged for a 4/5 French slender sheath. The sheath was flushed followed by installation of standard radial cocktail. Under direct fluoroscopic guidance, an NG to catheter and Bentson wire were directed to the level of the aortic arch. The descending thoracic aorta was unable to be cannulated with this catheter wire combination, therefore the catheter was exchanged for a pigtail catheter through which a Glidewire was directed to the  descending thoracic aorta. The catheter was exchanged for the mg to catheter which was then directed into the left common iliac artery. Left common iliac angiogram was performed which demonstrated patency and conventional branching architecture the internal and external iliac arteries. The internal iliac artery was selected through which the mg to catheter was advanced. Repeat angiogram was performed demonstrating similar appearance of the secure and prostatic arteries branching proximally from the internal pudendal artery. After extensive catheter and wire manipulation, the left prostatic artery was cannulated with a 1.9 French triple angled lambda microcatheter and Aristotle 14 standard microwire. Left prostatic angiogram was performed which demonstrated diffuse parenchymal enhancement about the left hemi prostate. 100 mcg nitroglycerin  was administered to this location. This was followed by embolization with 400 micron hydro pearls until stasis was achieved. The microcatheter was removed. The 5 French catheter was retracted and then directed into the right common iliac artery. Right common iliac angiogram was then performed which demonstrated patency of the internal external iliac arteries. Cannulation of the internal iliac artery was difficult due to extreme tortuosity and required utilization of the 2.4 [place] Swift ninja microcatheter for directionality. Internal iliac angiogram was then performed which demonstrated similar anatomic configuration as prior with prostatic artery arising from the proximal internal pudendal artery. After extensive catheter and wire manipulation, the  right prostatic artery was selected. Right prostatic angiogram demonstrated patency and opacification of the right hemi prostate. There is a small more superior branch which appear to possibly be vesicular. Due to tortuosity, this vessel was unable to be selected or excluded. Embolization was then performed from the more proximal right  prostatic artery with 400 micron embospheres until stasis was achieved. The catheters were removed. A TR band was applied to the left wrist in the sheath was removed. The patient tolerated the procedure well was transferred to the recovery area in good condition. IMPRESSION: Technically successful bilateral prostate artery embolization. Creasie Doctor, MD [facility] Radiology Electronically Signed   By: Creasie Doctor M.D.   On: 01/19/2024 07:54   IR US  Guide Vasc Access Left Result Date: 01/19/2024 INDICATION: 89 year old male with a history of benign prostatic hyperplasia with lower urinary tract symptoms requiring continuous catheterization status post attempted prostate artery embolization 11/01/23. The procedure was unsuccessful due to inability to cannulate the prostatic arteries bilaterally secondary to severe tortuosity of the iliac and anterior division arteries. He presents for repeat attempt today via left radial artery approach EXAM: 1. Ultrasound-guided vascular access of the left radial artery. 2. Selective catheterization and angiography of the left common iliac, left internal iliac, left prostatic, right common iliac, right internal iliac, and right prostatic arteries. 3. Bilateral prostate artery embolization. MEDICATIONS: Ciprofloxacin  400 mg IV. The antibiotic was administered within 1 hour of the procedure ANESTHESIA/SEDATION: Moderate (conscious) sedation was employed during this procedure. A total of Versed  3.5 mg and Fentanyl  175 mcg was administered intravenously. Moderate Sedation Time: 175 minutes. The patient's level of consciousness and vital signs were monitored continuously by radiology nursing throughout the procedure under my direct supervision. CONTRAST:  70mL OMNIPAQUE  IOHEXOL  300 MG/ML SOLN, 30mL VISIPAQUE  IODIXANOL  320 MG/ML IV SOLN FLUOROSCOPY: Radiation Exposure Index (as provided by the fluoroscopic device): 3,770 mGy reference  air Kerma COMPLICATIONS: None immediate. PROCEDURE: Informed consent was obtained from the patient following explanation of the procedure, risks, benefits and alternatives. The patient understands, agrees and consents for the procedure. All questions were addressed. A time out was performed prior to the initiation of the procedure. Maximal barrier sterile technique utilized including caps, mask, sterile gowns, sterile gloves, large sterile drape, hand hygiene, and Betadine prep. The left wrist was prepped and draped in standard fashion. Pulse oximeter was attached to the left thumb. Art Largo test was performed, grade A. The left radial artery measured 0.39 cm in diameter. Subdermal Local anesthesia was provided at the planned needle entry site with 1% lidocaine . A small skin nick was made. Under direct ultrasound visualization, the left radial artery was punctured with a 21 gauge micropuncture needle. A permanent image was captured and stored in the record. A microwire was placed and exchanged for a 4/5 French slender sheath. The sheath was flushed followed by installation of standard radial cocktail. Under direct fluoroscopic guidance, an NG to catheter and Bentson wire were directed to the level of the aortic arch. The descending thoracic aorta was unable to be cannulated with this catheter wire combination, therefore the catheter was exchanged for a pigtail catheter through which a Glidewire was directed to the descending thoracic aorta. The catheter was exchanged for the mg to catheter which was then directed into the left common iliac artery. Left common iliac angiogram was performed which demonstrated patency and conventional branching architecture the internal and external iliac arteries. The internal iliac artery was selected through which the mg  to catheter was advanced. Repeat angiogram was performed demonstrating similar appearance of the secure and prostatic arteries branching proximally from the internal  pudendal artery. After extensive catheter and wire manipulation, the left prostatic artery was cannulated with a 1.9 French triple angled lambda microcatheter and Aristotle 14 standard microwire. Left prostatic angiogram was performed which demonstrated diffuse parenchymal enhancement about the left hemi prostate. 100 mcg nitroglycerin  was administered to this location. This was followed by embolization with 400 micron hydro pearls until stasis was achieved. The microcatheter was removed. The 5 French catheter was retracted and then directed into the right common iliac artery. Right common iliac angiogram was then performed which demonstrated patency of the internal external iliac arteries. Cannulation of the internal iliac artery was difficult due to extreme tortuosity and required utilization of the 2.4 [place] Swift ninja microcatheter for directionality. Internal iliac angiogram was then performed which demonstrated similar anatomic configuration as prior with prostatic artery arising from the proximal internal pudendal artery. After extensive catheter and wire manipulation, the right prostatic artery was selected. Right prostatic angiogram demonstrated patency and opacification of the right hemi prostate. There is a small more superior branch which appear to possibly be vesicular. Due to tortuosity, this vessel was unable to be selected or excluded. Embolization was then performed from the more proximal right prostatic artery with 400 micron embospheres until stasis was achieved. The catheters were removed. A TR band was applied to the left wrist in the sheath was removed. The patient tolerated the procedure well was transferred to the recovery area in good condition. IMPRESSION: Technically successful bilateral prostate artery embolization. Creasie Doctor, MD [facility] Radiology Electronically Signed   By: Creasie Doctor M.D.   On: 01/19/2024 07:54   IR  Angiogram Pelvis Selective Or Supraselective Result Date: 01/19/2024 INDICATION: 89 year old male with a history of benign prostatic hyperplasia with lower urinary tract symptoms requiring continuous catheterization status post attempted prostate artery embolization 11/01/23. The procedure was unsuccessful due to inability to cannulate the prostatic arteries bilaterally secondary to severe tortuosity of the iliac and anterior division arteries. He presents for repeat attempt today via left radial artery approach EXAM: 1. Ultrasound-guided vascular access of the left radial artery. 2. Selective catheterization and angiography of the left common iliac, left internal iliac, left prostatic, right common iliac, right internal iliac, and right prostatic arteries. 3. Bilateral prostate artery embolization. MEDICATIONS: Ciprofloxacin  400 mg IV. The antibiotic was administered within 1 hour of the procedure ANESTHESIA/SEDATION: Moderate (conscious) sedation was employed during this procedure. A total of Versed  3.5 mg and Fentanyl  175 mcg was administered intravenously. Moderate Sedation Time: 175 minutes. The patient's level of consciousness and vital signs were monitored continuously by radiology nursing throughout the procedure under my direct supervision. CONTRAST:  70mL OMNIPAQUE  IOHEXOL  300 MG/ML SOLN, 30mL VISIPAQUE  IODIXANOL  320 MG/ML IV SOLN FLUOROSCOPY: Radiation Exposure Index (as provided by the fluoroscopic device): 3,770 mGy reference air Kerma COMPLICATIONS: None immediate. PROCEDURE: Informed consent was obtained from the patient following explanation of the procedure, risks, benefits and alternatives. The patient understands, agrees and consents for the procedure. All questions were addressed. A time out was performed prior to the initiation of the procedure. Maximal barrier sterile technique utilized including caps, mask, sterile gowns, sterile gloves, large sterile drape, hand hygiene, and Betadine prep.  The left wrist was prepped and draped in standard fashion. Pulse oximeter was attached to the left thumb. Art Largo test was performed, grade A. The left radial artery  measured 0.39 cm in diameter. Subdermal Local anesthesia was provided at the planned needle entry site with 1% lidocaine . A small skin nick was made. Under direct ultrasound visualization, the left radial artery was punctured with a 21 gauge micropuncture needle. A permanent image was captured and stored in the record. A microwire was placed and exchanged for a 4/5 French slender sheath. The sheath was flushed followed by installation of standard radial cocktail. Under direct fluoroscopic guidance, an NG to catheter and Bentson wire were directed to the level of the aortic arch. The descending thoracic aorta was unable to be cannulated with this catheter wire combination, therefore the catheter was exchanged for a pigtail catheter through which a Glidewire was directed to the descending thoracic aorta. The catheter was exchanged for the mg to catheter which was then directed into the left common iliac artery. Left common iliac angiogram was performed which demonstrated patency and conventional branching architecture the internal and external iliac arteries. The internal iliac artery was selected through which the mg to catheter was advanced. Repeat angiogram was performed demonstrating similar appearance of the secure and prostatic arteries branching proximally from the internal pudendal artery. After extensive catheter and wire manipulation, the left prostatic artery was cannulated with a 1.9 French triple angled lambda microcatheter and Aristotle 14 standard microwire. Left prostatic angiogram was performed which demonstrated diffuse parenchymal enhancement about the left hemi prostate. 100 mcg nitroglycerin  was administered to this location. This was followed by embolization with 400 micron hydro pearls until stasis was achieved. The microcatheter  was removed. The 5 French catheter was retracted and then directed into the right common iliac artery. Right common iliac angiogram was then performed which demonstrated patency of the internal external iliac arteries. Cannulation of the internal iliac artery was difficult due to extreme tortuosity and required utilization of the 2.4 [place] Swift ninja microcatheter for directionality. Internal iliac angiogram was then performed which demonstrated similar anatomic configuration as prior with prostatic artery arising from the proximal internal pudendal artery. After extensive catheter and wire manipulation, the right prostatic artery was selected. Right prostatic angiogram demonstrated patency and opacification of the right hemi prostate. There is a small more superior branch which appear to possibly be vesicular. Due to tortuosity, this vessel was unable to be selected or excluded. Embolization was then performed from the more proximal right prostatic artery with 400 micron embospheres until stasis was achieved. The catheters were removed. A TR band was applied to the left wrist in the sheath was removed. The patient tolerated the procedure well was transferred to the recovery area in good condition. IMPRESSION: Technically successful bilateral prostate artery embolization. Creasie Doctor, MD [facility] Radiology Electronically Signed   By: Creasie Doctor M.D.   On: 01/19/2024 07:54   IR Angiogram Selective Each Additional Vessel Result Date: 01/19/2024 INDICATION: 89 year old male with a history of benign prostatic hyperplasia with lower urinary tract symptoms requiring continuous catheterization status post attempted prostate artery embolization 11/01/23. The procedure was unsuccessful due to inability to cannulate the prostatic arteries bilaterally secondary to severe tortuosity of the iliac and anterior division arteries. He presents for repeat attempt today via  left radial artery approach EXAM: 1. Ultrasound-guided vascular access of the left radial artery. 2. Selective catheterization and angiography of the left common iliac, left internal iliac, left prostatic, right common iliac, right internal iliac, and right prostatic arteries. 3. Bilateral prostate artery embolization. MEDICATIONS: Ciprofloxacin  400 mg IV. The antibiotic was administered within 1  hour of the procedure ANESTHESIA/SEDATION: Moderate (conscious) sedation was employed during this procedure. A total of Versed  3.5 mg and Fentanyl  175 mcg was administered intravenously. Moderate Sedation Time: 175 minutes. The patient's level of consciousness and vital signs were monitored continuously by radiology nursing throughout the procedure under my direct supervision. CONTRAST:  70mL OMNIPAQUE  IOHEXOL  300 MG/ML SOLN, 30mL VISIPAQUE  IODIXANOL  320 MG/ML IV SOLN FLUOROSCOPY: Radiation Exposure Index (as provided by the fluoroscopic device): 3,770 mGy reference air Kerma COMPLICATIONS: None immediate. PROCEDURE: Informed consent was obtained from the patient following explanation of the procedure, risks, benefits and alternatives. The patient understands, agrees and consents for the procedure. All questions were addressed. A time out was performed prior to the initiation of the procedure. Maximal barrier sterile technique utilized including caps, mask, sterile gowns, sterile gloves, large sterile drape, hand hygiene, and Betadine prep. The left wrist was prepped and draped in standard fashion. Pulse oximeter was attached to the left thumb. Art Largo test was performed, grade A. The left radial artery measured 0.39 cm in diameter. Subdermal Local anesthesia was provided at the planned needle entry site with 1% lidocaine . A small skin nick was made. Under direct ultrasound visualization, the left radial artery was punctured with a 21 gauge micropuncture needle. A permanent image was captured and stored in the record. A  microwire was placed and exchanged for a 4/5 French slender sheath. The sheath was flushed followed by installation of standard radial cocktail. Under direct fluoroscopic guidance, an NG to catheter and Bentson wire were directed to the level of the aortic arch. The descending thoracic aorta was unable to be cannulated with this catheter wire combination, therefore the catheter was exchanged for a pigtail catheter through which a Glidewire was directed to the descending thoracic aorta. The catheter was exchanged for the mg to catheter which was then directed into the left common iliac artery. Left common iliac angiogram was performed which demonstrated patency and conventional branching architecture the internal and external iliac arteries. The internal iliac artery was selected through which the mg to catheter was advanced. Repeat angiogram was performed demonstrating similar appearance of the secure and prostatic arteries branching proximally from the internal pudendal artery. After extensive catheter and wire manipulation, the left prostatic artery was cannulated with a 1.9 French triple angled lambda microcatheter and Aristotle 14 standard microwire. Left prostatic angiogram was performed which demonstrated diffuse parenchymal enhancement about the left hemi prostate. 100 mcg nitroglycerin  was administered to this location. This was followed by embolization with 400 micron hydro pearls until stasis was achieved. The microcatheter was removed. The 5 French catheter was retracted and then directed into the right common iliac artery. Right common iliac angiogram was then performed which demonstrated patency of the internal external iliac arteries. Cannulation of the internal iliac artery was difficult due to extreme tortuosity and required utilization of the 2.4 [place] Swift ninja microcatheter for directionality. Internal iliac angiogram was then performed which demonstrated similar anatomic configuration as prior  with prostatic artery arising from the proximal internal pudendal artery. After extensive catheter and wire manipulation, the right prostatic artery was selected. Right prostatic angiogram demonstrated patency and opacification of the right hemi prostate. There is a small more superior branch which appear to possibly be vesicular. Due to tortuosity, this vessel was unable to be selected or excluded. Embolization was then performed from the more proximal right prostatic artery with 400 micron embospheres until stasis was achieved. The catheters were removed. A TR band was applied to the  left wrist in the sheath was removed. The patient tolerated the procedure well was transferred to the recovery area in good condition. IMPRESSION: Technically successful bilateral prostate artery embolization. Creasie Doctor, MD [facility] Electronically Signed   By: Creasie Doctor M.D.   On: 01/19/2024 07:54   IR Angiogram Selective Each Additional Vessel Result Date: 01/19/2024 INDICATION: 89 year old male with a history of benign prostatic hyperplasia with lower urinary tract symptoms requiring continuous catheterization status post attempted prostate artery embolization 11/01/23. The procedure was unsuccessful due to inability to cannulate the prostatic arteries bilaterally secondary to severe tortuosity of the iliac and anterior division arteries. He presents for repeat attempt today via left radial artery approach EXAM: 1. Ultrasound-guided vascular access of the left radial artery. 2. Selective catheterization and angiography of the left common iliac, left internal iliac, left prostatic, right common iliac, right internal iliac, and right prostatic arteries. 3. Bilateral prostate artery embolization. MEDICATIONS: Ciprofloxacin  400 mg IV. The antibiotic was administered within 1 hour of the procedure ANESTHESIA/SEDATION: Moderate (conscious) sedation was employed during  this procedure. A total of Versed  3.5 mg and Fentanyl  175 mcg was administered intravenously. Moderate Sedation Time: 175 minutes. The patient's level of consciousness and vital signs were monitored continuously by radiology nursing throughout the procedure under my direct supervision. CONTRAST:  70mL OMNIPAQUE  IOHEXOL  300 MG/ML SOLN, 30mL VISIPAQUE  IODIXANOL  320 MG/ML IV SOLN FLUOROSCOPY: Radiation Exposure Index (as provided by the fluoroscopic device): 3,770 mGy reference air Kerma COMPLICATIONS: None immediate. PROCEDURE: Informed consent was obtained from the patient following explanation of the procedure, risks, benefits and alternatives. The patient understands, agrees and consents for the procedure. All questions were addressed. A time out was performed prior to the initiation of the procedure. Maximal barrier sterile technique utilized including caps, mask, sterile gowns, sterile gloves, large sterile drape, hand hygiene, and Betadine prep. The left wrist was prepped and draped in standard fashion. Pulse oximeter was attached to the left thumb. Art Largo test was performed, grade A. The left radial artery measured 0.39 cm in diameter. Subdermal Local anesthesia was provided at the planned needle entry site with 1% lidocaine . A small skin nick was made. Under direct ultrasound visualization, the left radial artery was punctured with a 21 gauge micropuncture needle. A permanent image was captured and stored in the record. A microwire was placed and exchanged for a 4/5 French slender sheath. The sheath was flushed followed by installation of standard radial cocktail. Under direct fluoroscopic guidance, an NG to catheter and Bentson wire were directed to the level of the aortic arch. The descending thoracic aorta was unable to be cannulated with this catheter wire combination, therefore the catheter was exchanged for a pigtail catheter through which a Glidewire was directed to the descending thoracic aorta. The  catheter was exchanged for the mg to catheter which was then directed into the left common iliac artery. Left common iliac angiogram was performed which demonstrated patency and conventional branching architecture the internal and external iliac arteries. The internal iliac artery was selected through which the mg to catheter was advanced. Repeat angiogram was performed demonstrating similar appearance of the secure and prostatic arteries branching proximally from the internal pudendal artery. After extensive catheter and wire manipulation, the left prostatic artery was cannulated with a 1.9 French triple angled lambda microcatheter and Aristotle 14 standard microwire. Left prostatic angiogram was performed which demonstrated diffuse parenchymal enhancement about the left hemi prostate. 100 mcg nitroglycerin  was administered to this location.  This was followed by embolization with 400 micron hydro pearls until stasis was achieved. The microcatheter was removed. The 5 French catheter was retracted and then directed into the right common iliac artery. Right common iliac angiogram was then performed which demonstrated patency of the internal external iliac arteries. Cannulation of the internal iliac artery was difficult due to extreme tortuosity and required utilization of the 2.4 [place] Swift ninja microcatheter for directionality. Internal iliac angiogram was then performed which demonstrated similar anatomic configuration as prior with prostatic artery arising from the proximal internal pudendal artery. After extensive catheter and wire manipulation, the right prostatic artery was selected. Right prostatic angiogram demonstrated patency and opacification of the right hemi prostate. There is a small more superior branch which appear to possibly be vesicular. Due to tortuosity, this vessel was unable to be selected or excluded. Embolization was then performed from the more proximal right prostatic artery with 400  micron embospheres until stasis was achieved. The catheters were removed. A TR band was applied to the left wrist in the sheath was removed. The patient tolerated the procedure well was transferred to the recovery area in good condition. IMPRESSION: Technically successful bilateral prostate artery embolization. Creasie Doctor, MD [facility] Radiology Electronically Signed   By: Creasie Doctor M.D.   On: 01/19/2024 07:54   IR ABD/PEL BILAT ART BRANCH 3RD ORDER Result Date: 01/19/2024 INDICATION: 89 year old male with a history of benign prostatic hyperplasia with lower urinary tract symptoms requiring continuous catheterization status post attempted prostate artery embolization 11/01/23. The procedure was unsuccessful due to inability to cannulate the prostatic arteries bilaterally secondary to severe tortuosity of the iliac and anterior division arteries. He presents for repeat attempt today via left radial artery approach EXAM: 1. Ultrasound-guided vascular access of the left radial artery. 2. Selective catheterization and angiography of the left common iliac, left internal iliac, left prostatic, right common iliac, right internal iliac, and right prostatic arteries. 3. Bilateral prostate artery embolization. MEDICATIONS: Ciprofloxacin  400 mg IV. The antibiotic was administered within 1 hour of the procedure ANESTHESIA/SEDATION: Moderate (conscious) sedation was employed during this procedure. A total of Versed  3.5 mg and Fentanyl  175 mcg was administered intravenously. Moderate Sedation Time: 175 minutes. The patient's level of consciousness and vital signs were monitored continuously by radiology nursing throughout the procedure under my direct supervision. CONTRAST:  70mL OMNIPAQUE  IOHEXOL  300 MG/ML SOLN, 30mL VISIPAQUE  IODIXANOL  320 MG/ML IV SOLN FLUOROSCOPY: Radiation Exposure Index (as provided by the fluoroscopic device): 3,770 mGy reference air Kerma  COMPLICATIONS: None immediate. PROCEDURE: Informed consent was obtained from the patient following explanation of the procedure, risks, benefits and alternatives. The patient understands, agrees and consents for the procedure. All questions were addressed. A time out was performed prior to the initiation of the procedure. Maximal barrier sterile technique utilized including caps, mask, sterile gowns, sterile gloves, large sterile drape, hand hygiene, and Betadine prep. The left wrist was prepped and draped in standard fashion. Pulse oximeter was attached to the left thumb. Art Largo test was performed, grade A. The left radial artery measured 0.39 cm in diameter. Subdermal Local anesthesia was provided at the planned needle entry site with 1% lidocaine . A small skin nick was made. Under direct ultrasound visualization, the left radial artery was punctured with a 21 gauge micropuncture needle. A permanent image was captured and stored in the record. A microwire was placed and exchanged for a 4/5 French slender sheath. The sheath was flushed followed by installation of standard  radial cocktail. Under direct fluoroscopic guidance, an NG to catheter and Bentson wire were directed to the level of the aortic arch. The descending thoracic aorta was unable to be cannulated with this catheter wire combination, therefore the catheter was exchanged for a pigtail catheter through which a Glidewire was directed to the descending thoracic aorta. The catheter was exchanged for the mg to catheter which was then directed into the left common iliac artery. Left common iliac angiogram was performed which demonstrated patency and conventional branching architecture the internal and external iliac arteries. The internal iliac artery was selected through which the mg to catheter was advanced. Repeat angiogram was performed demonstrating similar appearance of the secure and prostatic arteries branching proximally from the internal pudendal  artery. After extensive catheter and wire manipulation, the left prostatic artery was cannulated with a 1.9 French triple angled lambda microcatheter and Aristotle 14 standard microwire. Left prostatic angiogram was performed which demonstrated diffuse parenchymal enhancement about the left hemi prostate. 100 mcg nitroglycerin  was administered to this location. This was followed by embolization with 400 micron hydro pearls until stasis was achieved. The microcatheter was removed. The 5 French catheter was retracted and then directed into the right common iliac artery. Right common iliac angiogram was then performed which demonstrated patency of the internal external iliac arteries. Cannulation of the internal iliac artery was difficult due to extreme tortuosity and required utilization of the 2.4 [place] Swift ninja microcatheter for directionality. Internal iliac angiogram was then performed which demonstrated similar anatomic configuration as prior with prostatic artery arising from the proximal internal pudendal artery. After extensive catheter and wire manipulation, the right prostatic artery was selected. Right prostatic angiogram demonstrated patency and opacification of the right hemi prostate. There is a small more superior branch which appear to possibly be vesicular. Due to tortuosity, this vessel was unable to be selected or excluded. Embolization was then performed from the more proximal right prostatic artery with 400 micron embospheres until stasis was achieved. The catheters were removed. A TR band was applied to the left wrist in the sheath was removed. The patient tolerated the procedure well was transferred to the recovery area in good condition. IMPRESSION: Technically successful bilateral prostate artery embolization. Creasie Doctor, MD [facility] Radiology Electronically Signed   By: Creasie Doctor M.D.   On: 01/19/2024 07:54     Labs:  CBC: Recent Labs    05/16/23 0548 11/01/23 0920 01/18/24 0706 01/20/24 0643  WBC 9.3 5.7 6.2 10.2  HGB 12.6* 14.5 14.6 13.7  HCT 39.0 43.2 45.0 41.4  PLT 172 196 221 167    COAGS: Recent Labs    11/01/23 0920 01/18/24 0706  INR 1.0 1.1    BMP: Recent Labs    05/16/23 0548 11/01/23 0920 01/18/24 0706 01/20/24 0643  NA 133* 138 137 133*  K 4.2 4.0 4.5 4.3  CL 101 106 107 101  CO2 21* 25 25 21*  GLUCOSE 113* 102* 89 122*  BUN 32* 28* 33* 25*  CALCIUM  8.5* 8.8* 9.2 8.5*  CREATININE 1.17 1.34* 1.12 1.18  GFRNONAA >60 51* >60 59*    LIVER FUNCTION TESTS: Recent Labs    03/18/23 1312  BILITOT 0.8  AST 18  ALT 17  ALKPHOS 62  PROT 7.1  ALBUMIN 3.6    Assessment and Plan:  89 year old male with a history of benign prostatic hyperplasia s/p prostate artery embolization 01/18/24. Patient now with complaints of dysuria.   I met with  the patient in the ED and he described random, intermittent sensations of pelvic pain. The pain is brief. His UA showed trace leukocytes and rare bacteria. He is afebrile. Patient is currently on Cipro  from PAE procedure. He had not been taking pyridium  as instructed.   UA findings discussed with patient and I explained that he is likely experiencing some bladder spasms/irritation from the PAE. Patient stated he felt reassured by this information. I encouraged him to take his post-procedure medications as directed. He will follow up with Dr. Jinx Mourning in one month but I gave the patient a number to reach me should he have worsening symptoms. Mr. Moten verbalized understanding and he is in agreement with the plan.    Electronically Signed: Jetta Morrow, AGACNP-BC 01/20/2024, 9:50 AM   I spent a total of 15 Minutes at the the patient's bedside AND on the patient's hospital floor or unit, greater than 50% of which was counseling/coordinating care for benign prostatic hyperplasia.

## 2024-01-20 NOTE — Discharge Instructions (Addendum)
 Call Dr. Jinx Mourning if you have any problems.  Take medication as directed

## 2024-01-20 NOTE — ED Triage Notes (Signed)
 Dysuria that began last night. Requesting foley catheter to be replaced as he suspects developing UTI.

## 2024-01-20 NOTE — ED Provider Notes (Signed)
 [place] EMERGENCY DEPARTMENT AT [facility] Provider Note   CSN: [id number] Arrival date & time: 01/20/24  0530     History  Chief Complaint  Patient presents with   Dysuria    Jack Randolph is a 89 y.o. male.  Patient complains of urinary discomfort.  Patient reports that he had a prostate artery embolization 2 days ago.  Patient had a Foley placed.  He is concerned that he is developing a urinary tract infection.  He states last night he had increasing discomfort and he was unable to sleep.  Patient is taking Cipro .  Patient is concerned that he needs to have the Foley changed.  Patient denies any fever or chills.  He is not having any abdominal pain.  Patient has a past medical history of enlarged prostate, hypertension and hyperlipidemia.   Dysuria Presenting symptoms: dysuria        Home Medications Prior to Admission medications   Medication Sig Start Date End Date Taking? Authorizing Provider  Ascorbic Acid (VITAMIN C) 250 MG CHEW Chew by mouth.    [provider]  bisacodyl (DULCOLAX) 5 MG EC tablet Take 5 mg by mouth daily as needed for moderate constipation.    [provider]  Calcium  Carbonate-Vitamin D (CALCIUM  500 + D PO) Take by mouth.    [provider]  ciprofloxacin  (CIPRO ) 500 MG tablet Take 1 tablet (500 mg total) by mouth 2 (two) times daily for 7 days. 01/17/24 01/24/24  Covington, Jamie R, NP  Coenzyme Q10 (COQ10) 100 MG CAPS Take by mouth.    [provider]  cyanocobalamin 100 MCG tablet Take 100 mcg by mouth daily.    [provider]  ezetimibe  (ZETIA ) 10 MG tablet Take 1 tablet by mouth once daily 11/23/23   Tabori, Katherine E, MD  loratadine-pseudoephedrine (CLARITIN-D 24-HOUR) 10-240 MG 24 hr tablet Take 1 tablet by mouth daily.    [provider]  Magnesium 400 MG TABS Take by mouth.    [provider]  Multiple Vitamins-Minerals (CENTRUM MULTI + OMEGA 3 PO) Take by  mouth.    [provider]  Multiple Vitamins-Minerals (MULTIVITAMIN ADULT) CHEW Chew by mouth.    [provider]  phenazopyridine  (PYRIDIUM ) 100 MG tablet Take 1 tablet (100 mg total) by mouth in the morning, at noon, and at bedtime for 7 days. 01/17/24 01/24/24  Covington, Jamie R, NP  phenazopyridine  (PYRIDIUM ) 200 MG tablet Take 1 tablet (200 mg total) by mouth 3 (three) times daily. 03/18/23   Deatra Face, MD  ramipril  (ALTACE ) 10 MG capsule Take 1 capsule by mouth once daily 12/01/23   Tabori, Katherine E, MD  solifenacin  (VESICARE ) 5 MG tablet Take 1 tablet (5 mg total) by mouth daily for 7 days. 01/17/24 01/24/24  Covington, Jamie R, NP  triamcinolone  cream (KENALOG ) 0.1 % APPLY  CREAM EXTERNALLY TWICE DAILY 04/11/23   Tabori, Katherine E, MD      Allergies    Statins    Review of Systems   Review of Systems  Genitourinary:  Positive for dysuria.  All other systems reviewed and are negative.   Physical Exam Updated Vital Signs BP (!) 142/100 (BP Location: Right Arm)   Pulse 68   Temp 98.1 F (36.7 C) (Oral)   Resp 18   SpO2 95%  Physical Exam Vitals and nursing note reviewed.  Constitutional:      Appearance: He is well-developed.  HENT:     Head: Normocephalic.  Cardiovascular:     Rate and Rhythm: Normal rate.  Pulmonary:     Effort: Pulmonary effort is normal.  Abdominal:     General: There is no distension.     Palpations: Abdomen is soft.     Comments: Foley draining, urine clear yellow  Musculoskeletal:        General: Normal range of motion.  Skin:    General: Skin is warm.  Neurological:     General: No focal deficit present.     Mental Status: He is alert and oriented to person, place, and time.     ED Results / Procedures / Treatments   Labs (all labs ordered are listed, but only abnormal results are displayed) Labs Reviewed  BASIC METABOLIC PANEL WITH GFR - Abnormal; Notable for the following components:      Result Value    Sodium 133 (*)    CO2 21 (*)    Glucose, Bld 122 (*)    BUN 25 (*)    Calcium  8.5 (*)    GFR, Estimated 59 (*)    All other components within normal limits  URINALYSIS, ROUTINE W REFLEX MICROSCOPIC - Abnormal; Notable for the following components:   Color, Urine AMBER (*)    Protein, ur 30 (*)    Leukocytes,Ua TRACE (*)    Bacteria, UA RARE (*)    All other components within normal limits  CBC WITH DIFFERENTIAL/PLATELET    EKG None  Radiology IR EMBO ARTERIAL NOT HEMORR HEMANG INC GUIDE ROADMAPPING Result Date: 01/19/2024 INDICATION: 89 year old male with a history of benign prostatic hyperplasia with lower urinary tract symptoms requiring continuous catheterization status post attempted prostate artery embolization 11/01/23. The procedure was unsuccessful due to inability to cannulate the prostatic arteries bilaterally secondary to severe tortuosity of the iliac and anterior division arteries. He presents for repeat attempt today via left radial artery approach EXAM: 1. Ultrasound-guided vascular access of the left radial artery. 2. Selective catheterization and angiography of the left common iliac, left internal iliac, left prostatic, right common iliac, right internal iliac, and right prostatic arteries. 3. Bilateral prostate artery embolization. MEDICATIONS: Ciprofloxacin  400 mg IV. The antibiotic was administered within 1 hour of the procedure ANESTHESIA/SEDATION: Moderate (conscious) sedation was employed during this procedure. A total of Versed  3.5 mg and Fentanyl  175 mcg was administered intravenously. Moderate Sedation Time: 175 minutes. The patient's level of consciousness and vital signs were monitored continuously by radiology nursing throughout the procedure under my direct supervision. CONTRAST:  70mL OMNIPAQUE  IOHEXOL  300 MG/ML SOLN, 30mL VISIPAQUE  IODIXANOL  320 MG/ML IV SOLN FLUOROSCOPY: Radiation Exposure Index (as provided by the fluoroscopic device): 3,770 mGy reference air  Kerma COMPLICATIONS: None immediate. PROCEDURE: Informed consent was obtained from the patient following explanation of the procedure, risks, benefits and alternatives. The patient understands, agrees and consents for the procedure. All questions were addressed. A time out was performed prior to the initiation of the procedure. Maximal barrier sterile technique utilized including caps, mask, sterile gowns, sterile gloves, large sterile drape, hand hygiene, and Betadine prep. The left wrist was prepped and draped in standard fashion. Pulse oximeter was attached to the left thumb. Art Largo test was performed, grade A. The left radial artery measured 0.39 cm in diameter. Subdermal Local anesthesia was provided at the planned needle entry site with 1% lidocaine . A small skin nick was made. Under direct ultrasound visualization, the left radial artery was punctured with a 21 gauge micropuncture needle. A permanent image was captured and stored in  the record. A microwire was placed and exchanged for a 4/5 French slender sheath. The sheath was flushed followed by installation of standard radial cocktail. Under direct fluoroscopic guidance, an NG to catheter and Bentson wire were directed to the level of the aortic arch. The descending thoracic aorta was unable to be cannulated with this catheter wire combination, therefore the catheter was exchanged for a pigtail catheter through which a Glidewire was directed to the descending thoracic aorta. The catheter was exchanged for the mg to catheter which was then directed into the left common iliac artery. Left common iliac angiogram was performed which demonstrated patency and conventional branching architecture the internal and external iliac arteries. The internal iliac artery was selected through which the mg to catheter was advanced. Repeat angiogram was performed demonstrating similar appearance of the secure and prostatic arteries branching proximally from the internal  pudendal artery. After extensive catheter and wire manipulation, the left prostatic artery was cannulated with a 1.9 French triple angled lambda microcatheter and Aristotle 14 standard microwire. Left prostatic angiogram was performed which demonstrated diffuse parenchymal enhancement about the left hemi prostate. 100 mcg nitroglycerin  was administered to this location. This was followed by embolization with 400 micron hydro pearls until stasis was achieved. The microcatheter was removed. The 5 French catheter was retracted and then directed into the right common iliac artery. Right common iliac angiogram was then performed which demonstrated patency of the internal external iliac arteries. Cannulation of the internal iliac artery was difficult due to extreme tortuosity and required utilization of the 2.4 [place] Swift ninja microcatheter for directionality. Internal iliac angiogram was then performed which demonstrated similar anatomic configuration as prior with prostatic artery arising from the proximal internal pudendal artery. After extensive catheter and wire manipulation, the right prostatic artery was selected. Right prostatic angiogram demonstrated patency and opacification of the right hemi prostate. There is a small more superior branch which appear to possibly be vesicular. Due to tortuosity, this vessel was unable to be selected or excluded. Embolization was then performed from the more proximal right prostatic artery with 400 micron embospheres until stasis was achieved. The catheters were removed. A TR band was applied to the left wrist in the sheath was removed. The patient tolerated the procedure well was transferred to the recovery area in good condition. IMPRESSION: Technically successful bilateral prostate artery embolization. Creasie Doctor, MD [facility] Radiology Electronically Signed   By: Creasie Doctor M.D.   On: 01/19/2024 07:54   IR US  Guide  Vasc Access Left Result Date: 01/19/2024 INDICATION: 89 year old male with a history of benign prostatic hyperplasia with lower urinary tract symptoms requiring continuous catheterization status post attempted prostate artery embolization 11/01/23. The procedure was unsuccessful due to inability to cannulate the prostatic arteries bilaterally secondary to severe tortuosity of the iliac and anterior division arteries. He presents for repeat attempt today via left radial artery approach EXAM: 1. Ultrasound-guided vascular access of the left radial artery. 2. Selective catheterization and angiography of the left common iliac, left internal iliac, left prostatic, right common iliac, right internal iliac, and right prostatic arteries. 3. Bilateral prostate artery embolization. MEDICATIONS: Ciprofloxacin  400 mg IV. The antibiotic was administered within 1 hour of the procedure ANESTHESIA/SEDATION: Moderate (conscious) sedation was employed during this procedure. A total of Versed  3.5 mg and Fentanyl  175 mcg was administered intravenously. Moderate Sedation Time: 175 minutes. The patient's level of consciousness and vital signs were monitored continuously by radiology nursing throughout the procedure under  my direct supervision. CONTRAST:  70mL OMNIPAQUE  IOHEXOL  300 MG/ML SOLN, 30mL VISIPAQUE  IODIXANOL  320 MG/ML IV SOLN FLUOROSCOPY: Radiation Exposure Index (as provided by the fluoroscopic device): 3,770 mGy reference air Kerma COMPLICATIONS: None immediate. PROCEDURE: Informed consent was obtained from the patient following explanation of the procedure, risks, benefits and alternatives. The patient understands, agrees and consents for the procedure. All questions were addressed. A time out was performed prior to the initiation of the procedure. Maximal barrier sterile technique utilized including caps, mask, sterile gowns, sterile gloves, large sterile drape, hand hygiene, and Betadine prep. The left wrist was prepped and  draped in standard fashion. Pulse oximeter was attached to the left thumb. Art Largo test was performed, grade A. The left radial artery measured 0.39 cm in diameter. Subdermal Local anesthesia was provided at the planned needle entry site with 1% lidocaine . A small skin nick was made. Under direct ultrasound visualization, the left radial artery was punctured with a 21 gauge micropuncture needle. A permanent image was captured and stored in the record. A microwire was placed and exchanged for a 4/5 French slender sheath. The sheath was flushed followed by installation of standard radial cocktail. Under direct fluoroscopic guidance, an NG to catheter and Bentson wire were directed to the level of the aortic arch. The descending thoracic aorta was unable to be cannulated with this catheter wire combination, therefore the catheter was exchanged for a pigtail catheter through which a Glidewire was directed to the descending thoracic aorta. The catheter was exchanged for the mg to catheter which was then directed into the left common iliac artery. Left common iliac angiogram was performed which demonstrated patency and conventional branching architecture the internal and external iliac arteries. The internal iliac artery was selected through which the mg to catheter was advanced. Repeat angiogram was performed demonstrating similar appearance of the secure and prostatic arteries branching proximally from the internal pudendal artery. After extensive catheter and wire manipulation, the left prostatic artery was cannulated with a 1.9 French triple angled lambda microcatheter and Aristotle 14 standard microwire. Left prostatic angiogram was performed which demonstrated diffuse parenchymal enhancement about the left hemi prostate. 100 mcg nitroglycerin  was administered to this location. This was followed by embolization with 400 micron hydro pearls until stasis was achieved. The microcatheter was removed. The 5 French  catheter was retracted and then directed into the right common iliac artery. Right common iliac angiogram was then performed which demonstrated patency of the internal external iliac arteries. Cannulation of the internal iliac artery was difficult due to extreme tortuosity and required utilization of the 2.4 [place] Swift ninja microcatheter for directionality. Internal iliac angiogram was then performed which demonstrated similar anatomic configuration as prior with prostatic artery arising from the proximal internal pudendal artery. After extensive catheter and wire manipulation, the right prostatic artery was selected. Right prostatic angiogram demonstrated patency and opacification of the right hemi prostate. There is a small more superior branch which appear to possibly be vesicular. Due to tortuosity, this vessel was unable to be selected or excluded. Embolization was then performed from the more proximal right prostatic artery with 400 micron embospheres until stasis was achieved. The catheters were removed. A TR band was applied to the left wrist in the sheath was removed. The patient tolerated the procedure well was transferred to the recovery area in good condition. IMPRESSION: Technically successful bilateral prostate artery embolization. Creasie Doctor, MD [facility] Radiology Electronically Signed   By: Jolaine Nasuti.D.  On: 01/19/2024 07:54   IR Angiogram Pelvis Selective Or Supraselective Result Date: 01/19/2024 INDICATION: 89 year old male with a history of benign prostatic hyperplasia with lower urinary tract symptoms requiring continuous catheterization status post attempted prostate artery embolization 11/01/23. The procedure was unsuccessful due to inability to cannulate the prostatic arteries bilaterally secondary to severe tortuosity of the iliac and anterior division arteries. He presents for repeat attempt today via left radial artery  approach EXAM: 1. Ultrasound-guided vascular access of the left radial artery. 2. Selective catheterization and angiography of the left common iliac, left internal iliac, left prostatic, right common iliac, right internal iliac, and right prostatic arteries. 3. Bilateral prostate artery embolization. MEDICATIONS: Ciprofloxacin  400 mg IV. The antibiotic was administered within 1 hour of the procedure ANESTHESIA/SEDATION: Moderate (conscious) sedation was employed during this procedure. A total of Versed  3.5 mg and Fentanyl  175 mcg was administered intravenously. Moderate Sedation Time: 175 minutes. The patient's level of consciousness and vital signs were monitored continuously by radiology nursing throughout the procedure under my direct supervision. CONTRAST:  70mL OMNIPAQUE  IOHEXOL  300 MG/ML SOLN, 30mL VISIPAQUE  IODIXANOL  320 MG/ML IV SOLN FLUOROSCOPY: Radiation Exposure Index (as provided by the fluoroscopic device): 3,770 mGy reference air Kerma COMPLICATIONS: None immediate. PROCEDURE: Informed consent was obtained from the patient following explanation of the procedure, risks, benefits and alternatives. The patient understands, agrees and consents for the procedure. All questions were addressed. A time out was performed prior to the initiation of the procedure. Maximal barrier sterile technique utilized including caps, mask, sterile gowns, sterile gloves, large sterile drape, hand hygiene, and Betadine prep. The left wrist was prepped and draped in standard fashion. Pulse oximeter was attached to the left thumb. Art Largo test was performed, grade A. The left radial artery measured 0.39 cm in diameter. Subdermal Local anesthesia was provided at the planned needle entry site with 1% lidocaine . A small skin nick was made. Under direct ultrasound visualization, the left radial artery was punctured with a 21 gauge micropuncture needle. A permanent image was captured and stored in the record. A microwire was placed  and exchanged for a 4/5 French slender sheath. The sheath was flushed followed by installation of standard radial cocktail. Under direct fluoroscopic guidance, an NG to catheter and Bentson wire were directed to the level of the aortic arch. The descending thoracic aorta was unable to be cannulated with this catheter wire combination, therefore the catheter was exchanged for a pigtail catheter through which a Glidewire was directed to the descending thoracic aorta. The catheter was exchanged for the mg to catheter which was then directed into the left common iliac artery. Left common iliac angiogram was performed which demonstrated patency and conventional branching architecture the internal and external iliac arteries. The internal iliac artery was selected through which the mg to catheter was advanced. Repeat angiogram was performed demonstrating similar appearance of the secure and prostatic arteries branching proximally from the internal pudendal artery. After extensive catheter and wire manipulation, the left prostatic artery was cannulated with a 1.9 French triple angled lambda microcatheter and Aristotle 14 standard microwire. Left prostatic angiogram was performed which demonstrated diffuse parenchymal enhancement about the left hemi prostate. 100 mcg nitroglycerin  was administered to this location. This was followed by embolization with 400 micron hydro pearls until stasis was achieved. The microcatheter was removed. The 5 French catheter was retracted and then directed into the right common iliac artery. Right common iliac angiogram was then performed which demonstrated patency of the internal external iliac arteries. Cannulation  of the internal iliac artery was difficult due to extreme tortuosity and required utilization of the 2.4 [place] Swift ninja microcatheter for directionality. Internal iliac angiogram was then performed which demonstrated similar anatomic configuration as prior with prostatic  artery arising from the proximal internal pudendal artery. After extensive catheter and wire manipulation, the right prostatic artery was selected. Right prostatic angiogram demonstrated patency and opacification of the right hemi prostate. There is a small more superior branch which appear to possibly be vesicular. Due to tortuosity, this vessel was unable to be selected or excluded. Embolization was then performed from the more proximal right prostatic artery with 400 micron embospheres until stasis was achieved. The catheters were removed. A TR band was applied to the left wrist in the sheath was removed. The patient tolerated the procedure well was transferred to the recovery area in good condition. IMPRESSION: Technically successful bilateral prostate artery embolization. Creasie Doctor, MD [facility] Radiology Electronically Signed   By: Creasie Doctor M.D.   On: 01/19/2024 07:54   IR Angiogram Selective Each Additional Vessel Result Date: 01/19/2024 INDICATION: 89 year old male with a history of benign prostatic hyperplasia with lower urinary tract symptoms requiring continuous catheterization status post attempted prostate artery embolization 11/01/23. The procedure was unsuccessful due to inability to cannulate the prostatic arteries bilaterally secondary to severe tortuosity of the iliac and anterior division arteries. He presents for repeat attempt today via left radial artery approach EXAM: 1. Ultrasound-guided vascular access of the left radial artery. 2. Selective catheterization and angiography of the left common iliac, left internal iliac, left prostatic, right common iliac, right internal iliac, and right prostatic arteries. 3. Bilateral prostate artery embolization. MEDICATIONS: Ciprofloxacin  400 mg IV. The antibiotic was administered within 1 hour of the procedure ANESTHESIA/SEDATION: Moderate (conscious) sedation was employed during this procedure.  A total of Versed  3.5 mg and Fentanyl  175 mcg was administered intravenously. Moderate Sedation Time: 175 minutes. The patient's level of consciousness and vital signs were monitored continuously by radiology nursing throughout the procedure under my direct supervision. CONTRAST:  70mL OMNIPAQUE  IOHEXOL  300 MG/ML SOLN, 30mL VISIPAQUE  IODIXANOL  320 MG/ML IV SOLN FLUOROSCOPY: Radiation Exposure Index (as provided by the fluoroscopic device): 3,770 mGy reference air Kerma COMPLICATIONS: None immediate. PROCEDURE: Informed consent was obtained from the patient following explanation of the procedure, risks, benefits and alternatives. The patient understands, agrees and consents for the procedure. All questions were addressed. A time out was performed prior to the initiation of the procedure. Maximal barrier sterile technique utilized including caps, mask, sterile gowns, sterile gloves, large sterile drape, hand hygiene, and Betadine prep. The left wrist was prepped and draped in standard fashion. Pulse oximeter was attached to the left thumb. Art Largo test was performed, grade A. The left radial artery measured 0.39 cm in diameter. Subdermal Local anesthesia was provided at the planned needle entry site with 1% lidocaine . A small skin nick was made. Under direct ultrasound visualization, the left radial artery was punctured with a 21 gauge micropuncture needle. A permanent image was captured and stored in the record. A microwire was placed and exchanged for a 4/5 French slender sheath. The sheath was flushed followed by installation of standard radial cocktail. Under direct fluoroscopic guidance, an NG to catheter and Bentson wire were directed to the level of the aortic arch. The descending thoracic aorta was unable to be cannulated with this catheter wire combination, therefore the catheter was exchanged for a pigtail catheter through which a Glidewire was directed  to the descending thoracic aorta. The catheter was  exchanged for the mg to catheter which was then directed into the left common iliac artery. Left common iliac angiogram was performed which demonstrated patency and conventional branching architecture the internal and external iliac arteries. The internal iliac artery was selected through which the mg to catheter was advanced. Repeat angiogram was performed demonstrating similar appearance of the secure and prostatic arteries branching proximally from the internal pudendal artery. After extensive catheter and wire manipulation, the left prostatic artery was cannulated with a 1.9 French triple angled lambda microcatheter and Aristotle 14 standard microwire. Left prostatic angiogram was performed which demonstrated diffuse parenchymal enhancement about the left hemi prostate. 100 mcg nitroglycerin  was administered to this location. This was followed by embolization with 400 micron hydro pearls until stasis was achieved. The microcatheter was removed. The 5 French catheter was retracted and then directed into the right common iliac artery. Right common iliac angiogram was then performed which demonstrated patency of the internal external iliac arteries. Cannulation of the internal iliac artery was difficult due to extreme tortuosity and required utilization of the 2.4 [place] Swift ninja microcatheter for directionality. Internal iliac angiogram was then performed which demonstrated similar anatomic configuration as prior with prostatic artery arising from the proximal internal pudendal artery. After extensive catheter and wire manipulation, the right prostatic artery was selected. Right prostatic angiogram demonstrated patency and opacification of the right hemi prostate. There is a small more superior branch which appear to possibly be vesicular. Due to tortuosity, this vessel was unable to be selected or excluded. Embolization was then performed from the more proximal right prostatic artery with 400 micron  embospheres until stasis was achieved. The catheters were removed. A TR band was applied to the left wrist in the sheath was removed. The patient tolerated the procedure well was transferred to the recovery area in good condition. IMPRESSION: Technically successful bilateral prostate artery embolization. Creasie Doctor, MD [facility] Radiology Electronically Signed   By: Creasie Doctor M.D.   On: 01/19/2024 07:54   IR Angiogram Selective Each Additional Vessel Result Date: 01/19/2024 INDICATION: 89 year old male with a history of benign prostatic hyperplasia with lower urinary tract symptoms requiring continuous catheterization status post attempted prostate artery embolization 11/01/23. The procedure was unsuccessful due to inability to cannulate the prostatic arteries bilaterally secondary to severe tortuosity of the iliac and anterior division arteries. He presents for repeat attempt today via left radial artery approach EXAM: 1. Ultrasound-guided vascular access of the left radial artery. 2. Selective catheterization and angiography of the left common iliac, left internal iliac, left prostatic, right common iliac, right internal iliac, and right prostatic arteries. 3. Bilateral prostate artery embolization. MEDICATIONS: Ciprofloxacin  400 mg IV. The antibiotic was administered within 1 hour of the procedure ANESTHESIA/SEDATION: Moderate (conscious) sedation was employed during this procedure. A total of Versed  3.5 mg and Fentanyl  175 mcg was administered intravenously. Moderate Sedation Time: 175 minutes. The patient's level of consciousness and vital signs were monitored continuously by radiology nursing throughout the procedure under my direct supervision. CONTRAST:  70mL OMNIPAQUE  IOHEXOL  300 MG/ML SOLN, 30mL VISIPAQUE  IODIXANOL  320 MG/ML IV SOLN FLUOROSCOPY: Radiation Exposure Index (as provided by the fluoroscopic device): 3,770 mGy reference air Kerma  COMPLICATIONS: None immediate. PROCEDURE: Informed consent was obtained from the patient following explanation of the procedure, risks, benefits and alternatives. The patient understands, agrees and consents for the procedure. All questions were addressed. A time out was performed prior to  the initiation of the procedure. Maximal barrier sterile technique utilized including caps, mask, sterile gowns, sterile gloves, large sterile drape, hand hygiene, and Betadine prep. The left wrist was prepped and draped in standard fashion. Pulse oximeter was attached to the left thumb. Art Largo test was performed, grade A. The left radial artery measured 0.39 cm in diameter. Subdermal Local anesthesia was provided at the planned needle entry site with 1% lidocaine . A small skin nick was made. Under direct ultrasound visualization, the left radial artery was punctured with a 21 gauge micropuncture needle. A permanent image was captured and stored in the record. A microwire was placed and exchanged for a 4/5 French slender sheath. The sheath was flushed followed by installation of standard radial cocktail. Under direct fluoroscopic guidance, an NG to catheter and Bentson wire were directed to the level of the aortic arch. The descending thoracic aorta was unable to be cannulated with this catheter wire combination, therefore the catheter was exchanged for a pigtail catheter through which a Glidewire was directed to the descending thoracic aorta. The catheter was exchanged for the mg to catheter which was then directed into the left common iliac artery. Left common iliac angiogram was performed which demonstrated patency and conventional branching architecture the internal and external iliac arteries. The internal iliac artery was selected through which the mg to catheter was advanced. Repeat angiogram was performed demonstrating similar appearance of the secure and prostatic arteries branching proximally from the internal pudendal  artery. After extensive catheter and wire manipulation, the left prostatic artery was cannulated with a 1.9 French triple angled lambda microcatheter and Aristotle 14 standard microwire. Left prostatic angiogram was performed which demonstrated diffuse parenchymal enhancement about the left hemi prostate. 100 mcg nitroglycerin  was administered to this location. This was followed by embolization with 400 micron hydro pearls until stasis was achieved. The microcatheter was removed. The 5 French catheter was retracted and then directed into the right common iliac artery. Right common iliac angiogram was then performed which demonstrated patency of the internal external iliac arteries. Cannulation of the internal iliac artery was difficult due to extreme tortuosity and required utilization of the 2.4 [place] Swift ninja microcatheter for directionality. Internal iliac angiogram was then performed which demonstrated similar anatomic configuration as prior with prostatic artery arising from the proximal internal pudendal artery. After extensive catheter and wire manipulation, the right prostatic artery was selected. Right prostatic angiogram demonstrated patency and opacification of the right hemi prostate. There is a small more superior branch which appear to possibly be vesicular. Due to tortuosity, this vessel was unable to be selected or excluded. Embolization was then performed from the more proximal right prostatic artery with 400 micron embospheres until stasis was achieved. The catheters were removed. A TR band was applied to the left wrist in the sheath was removed. The patient tolerated the procedure well was transferred to the recovery area in good condition. IMPRESSION: Technically successful bilateral prostate artery embolization. Creasie Doctor, MD [facility] Radiology Electronically Signed   By: Creasie Doctor M.D.   On: 01/19/2024 07:54   IR ABD/PEL BILAT ART  BRANCH 3RD ORDER Result Date: 01/19/2024 INDICATION: 89 year old male with a history of benign prostatic hyperplasia with lower urinary tract symptoms requiring continuous catheterization status post attempted prostate artery embolization 11/01/23. The procedure was unsuccessful due to inability to cannulate the prostatic arteries bilaterally secondary to severe tortuosity of the iliac and anterior division arteries. He presents for repeat attempt today via left  radial artery approach EXAM: 1. Ultrasound-guided vascular access of the left radial artery. 2. Selective catheterization and angiography of the left common iliac, left internal iliac, left prostatic, right common iliac, right internal iliac, and right prostatic arteries. 3. Bilateral prostate artery embolization. MEDICATIONS: Ciprofloxacin  400 mg IV. The antibiotic was administered within 1 hour of the procedure ANESTHESIA/SEDATION: Moderate (conscious) sedation was employed during this procedure. A total of Versed  3.5 mg and Fentanyl  175 mcg was administered intravenously. Moderate Sedation Time: 175 minutes. The patient's level of consciousness and vital signs were monitored continuously by radiology nursing throughout the procedure under my direct supervision. CONTRAST:  70mL OMNIPAQUE  IOHEXOL  300 MG/ML SOLN, 30mL VISIPAQUE  IODIXANOL  320 MG/ML IV SOLN FLUOROSCOPY: Radiation Exposure Index (as provided by the fluoroscopic device): 3,770 mGy reference air Kerma COMPLICATIONS: None immediate. PROCEDURE: Informed consent was obtained from the patient following explanation of the procedure, risks, benefits and alternatives. The patient understands, agrees and consents for the procedure. All questions were addressed. A time out was performed prior to the initiation of the procedure. Maximal barrier sterile technique utilized including caps, mask, sterile gowns, sterile gloves, large sterile drape, hand hygiene, and Betadine prep. The left wrist was prepped and  draped in standard fashion. Pulse oximeter was attached to the left thumb. Art Largo test was performed, grade A. The left radial artery measured 0.39 cm in diameter. Subdermal Local anesthesia was provided at the planned needle entry site with 1% lidocaine . A small skin nick was made. Under direct ultrasound visualization, the left radial artery was punctured with a 21 gauge micropuncture needle. A permanent image was captured and stored in the record. A microwire was placed and exchanged for a 4/5 French slender sheath. The sheath was flushed followed by installation of standard radial cocktail. Under direct fluoroscopic guidance, an NG to catheter and Bentson wire were directed to the level of the aortic arch. The descending thoracic aorta was unable to be cannulated with this catheter wire combination, therefore the catheter was exchanged for a pigtail catheter through which a Glidewire was directed to the descending thoracic aorta. The catheter was exchanged for the mg to catheter which was then directed into the left common iliac artery. Left common iliac angiogram was performed which demonstrated patency and conventional branching architecture the internal and external iliac arteries. The internal iliac artery was selected through which the mg to catheter was advanced. Repeat angiogram was performed demonstrating similar appearance of the secure and prostatic arteries branching proximally from the internal pudendal artery. After extensive catheter and wire manipulation, the left prostatic artery was cannulated with a 1.9 French triple angled lambda microcatheter and Aristotle 14 standard microwire. Left prostatic angiogram was performed which demonstrated diffuse parenchymal enhancement about the left hemi prostate. 100 mcg nitroglycerin  was administered to this location. This was followed by embolization with 400 micron hydro pearls until stasis was achieved. The microcatheter was removed. The 5 French  catheter was retracted and then directed into the right common iliac artery. Right common iliac angiogram was then performed which demonstrated patency of the internal external iliac arteries. Cannulation of the internal iliac artery was difficult due to extreme tortuosity and required utilization of the 2.4 [place] Swift ninja microcatheter for directionality. Internal iliac angiogram was then performed which demonstrated similar anatomic configuration as prior with prostatic artery arising from the proximal internal pudendal artery. After extensive catheter and wire manipulation, the right prostatic artery was selected. Right prostatic angiogram demonstrated patency and opacification of the right hemi prostate. There  is a small more superior branch which appear to possibly be vesicular. Due to tortuosity, this vessel was unable to be selected or excluded. Embolization was then performed from the more proximal right prostatic artery with 400 micron embospheres until stasis was achieved. The catheters were removed. A TR band was applied to the left wrist in the sheath was removed. The patient tolerated the procedure well was transferred to the recovery area in good condition. IMPRESSION: Technically successful bilateral prostate artery embolization. Creasie Doctor, MD [facility] Radiology Electronically Signed   By: Creasie Doctor M.D.   On: 01/19/2024 07:54    Procedures Procedures    Medications Ordered in ED Medications - No data to display  ED Course/ Medical Decision Making/ A&P                                 Medical Decision Making Patient had a prostate artery embolization on 529.  He is here complaining of discomfort with urination.  Amount and/or Complexity of Data Reviewed Labs: ordered. Decision-making details documented in ED Course.    Details: UA shows 6-10 white blood cells and 6-10 red blood cells.  GFR is 59. Discussion of management  or test interpretation with external provider(s): I spoke with Dr. Julietta Ogren interventional radiology. He will see and evaluate pt.  IR Pa evaluated pt.  Pain most likely from inflammation due to procedure. She advised pt to follow up in 2 weeks.  Pt advised to call Dr. Jinx Mourning if symptoms worsen or change.            Final Clinical Impression(s) / ED Diagnoses Final diagnoses:  Dysuria    Rx / DC Orders ED Discharge Orders     None     An After Visit Summary was printed and given to the patient.     Sandi Crosby, PA-C 01/20/24 0957    Mozell Arias, MD 01/22/24 [phone]

## 2024-01-21 LAB — URINE CULTURE: Culture: NO GROWTH

## 2024-01-22 ENCOUNTER — Other Ambulatory Visit (HOSPITAL_COMMUNITY): Payer: Self-pay | Admitting: Interventional Radiology

## 2024-01-22 ENCOUNTER — Encounter (HOSPITAL_COMMUNITY): Payer: Self-pay | Admitting: Radiology

## 2024-01-22 DIAGNOSIS — N401 Enlarged prostate with lower urinary tract symptoms: Secondary | ICD-10-CM

## 2024-02-06 DIAGNOSIS — R3914 Feeling of incomplete bladder emptying: Secondary | ICD-10-CM | POA: Diagnosis not present

## 2024-02-21 ENCOUNTER — Other Ambulatory Visit: Payer: Self-pay | Admitting: Family Medicine

## 2024-02-21 DIAGNOSIS — E785 Hyperlipidemia, unspecified: Secondary | ICD-10-CM

## 2024-02-21 NOTE — Telephone Encounter (Signed)
Pt scheduled for 7/9.

## 2024-02-21 NOTE — Telephone Encounter (Signed)
 Can we get him a sooner appt with Dr Mahlon last OV 02/01/2023

## 2024-02-28 ENCOUNTER — Encounter: Payer: Self-pay | Admitting: Family Medicine

## 2024-02-28 ENCOUNTER — Ambulatory Visit: Admitting: Family Medicine

## 2024-02-28 ENCOUNTER — Other Ambulatory Visit: Payer: Self-pay | Admitting: Family Medicine

## 2024-02-28 ENCOUNTER — Ambulatory Visit: Payer: Self-pay | Admitting: Family Medicine

## 2024-02-28 VITALS — BP 102/72 | HR 61 | Temp 98.2°F | Ht 71.0 in | Wt 200.2 lb

## 2024-02-28 DIAGNOSIS — E785 Hyperlipidemia, unspecified: Secondary | ICD-10-CM | POA: Diagnosis not present

## 2024-02-28 DIAGNOSIS — I10 Essential (primary) hypertension: Secondary | ICD-10-CM

## 2024-02-28 DIAGNOSIS — E663 Overweight: Secondary | ICD-10-CM | POA: Diagnosis not present

## 2024-02-28 LAB — LIPID PANEL
Cholesterol: 201 mg/dL — ABNORMAL HIGH (ref 0–200)
HDL: 38.3 mg/dL — ABNORMAL LOW (ref >39.00)
LDL Cholesterol: 114 mg/dL — ABNORMAL HIGH (ref 0–99)
NonHDL: 162.78
Total CHOL/HDL Ratio: 5
Triglycerides: 246 mg/dL — ABNORMAL HIGH (ref 0.0–149.0)
VLDL: 49.2 mg/dL — ABNORMAL HIGH (ref 0.0–40.0)

## 2024-02-28 LAB — HEPATIC FUNCTION PANEL
ALT: 15 U/L (ref 0–53)
AST: 17 U/L (ref 0–37)
Albumin: 4.1 g/dL (ref 3.5–5.2)
Alkaline Phosphatase: 63 U/L (ref 39–117)
Bilirubin, Direct: 0.1 mg/dL (ref 0.0–0.3)
Total Bilirubin: 0.4 mg/dL (ref 0.2–1.2)
Total Protein: 7.3 g/dL (ref 6.0–8.3)

## 2024-02-28 LAB — CBC WITH DIFFERENTIAL/PLATELET
Basophils Absolute: 0 K/uL (ref 0.0–0.1)
Basophils Relative: 0.3 % (ref 0.0–3.0)
Eosinophils Absolute: 0.3 K/uL (ref 0.0–0.7)
Eosinophils Relative: 4.4 % (ref 0.0–5.0)
HCT: 41.9 % (ref 39.0–52.0)
Hemoglobin: 14.1 g/dL (ref 13.0–17.0)
Lymphocytes Relative: 35.8 % (ref 12.0–46.0)
Lymphs Abs: 2.2 K/uL (ref 0.7–4.0)
MCHC: 33.7 g/dL (ref 30.0–36.0)
MCV: 88.2 fl (ref 78.0–100.0)
Monocytes Absolute: 0.4 K/uL (ref 0.1–1.0)
Monocytes Relative: 7.2 % (ref 3.0–12.0)
Neutro Abs: 3.2 K/uL (ref 1.4–7.7)
Neutrophils Relative %: 52.3 % (ref 43.0–77.0)
Platelets: 187 K/uL (ref 150.0–400.0)
RBC: 4.74 Mil/uL (ref 4.22–5.81)
RDW: 14.4 % (ref 11.5–15.5)
WBC: 6 K/uL (ref 4.0–10.5)

## 2024-02-28 LAB — BASIC METABOLIC PANEL WITH GFR
BUN: 21 mg/dL (ref 6–23)
CO2: 29 meq/L (ref 19–32)
Calcium: 9 mg/dL (ref 8.4–10.5)
Chloride: 105 meq/L (ref 96–112)
Creatinine, Ser: 1.15 mg/dL (ref 0.40–1.50)
GFR: 56.79 mL/min — ABNORMAL LOW (ref >60.00)
Glucose, Bld: 97 mg/dL (ref 70–99)
Potassium: 4.4 meq/L (ref 3.5–5.1)
Sodium: 139 meq/L (ref 135–145)

## 2024-02-28 LAB — TSH: TSH: 1.38 u[IU]/mL (ref 0.35–5.50)

## 2024-02-28 MED ORDER — RAMIPRIL 10 MG PO CAPS: 10.0000 mg | ORAL_CAPSULE | Freq: Every day | ORAL | 0 refills | Status: DC

## 2024-02-28 NOTE — Assessment & Plan Note (Signed)
 Chronic problem.  Currently on Zetia  10mg  daily due to statin intolerance.  Check labs.  Adjust meds prn

## 2024-02-28 NOTE — Assessment & Plan Note (Signed)
 Chronic problem.  On Ramipril  10mg  daily w/ good control.  Currently asymptomatic.  Check labs due to ACE use but no anticipated med changes.

## 2024-02-28 NOTE — Patient Instructions (Signed)
 Schedule your complete physical in 6 months We'll notify you of your lab results and make any changes if needed Continue to work on healthy diet and regular exercise- you can do it! Call with any questions or concerns Stay Safe!  Stay Healthy! Have a great summer!!! 

## 2024-02-28 NOTE — Progress Notes (Signed)
   Subjective:    Patient ID: Jack Randolph, male    DOB: June 05, 1933, 89 y.o.   MRN: [id number]  HPI HTN- chronic problem, on Ramipril  10mg  daily w/ good control.  No CP, SOB, HA's, visual changes, edema.  Hyperlipidemia- chronic problem, on Zetia  10mg  daily due to statin intolerance.  No abd pain, N/V.  Overweight- pt has gained 10lbs since last visit.  BMI now 27.93.  He is walking regularly but has to be mindful of the heat.   Review of Systems For ROS see HPI     Objective:   Physical Exam Vitals reviewed.  Constitutional:      General: He is not in acute distress.    Appearance: Normal appearance. He is well-developed. He is not ill-appearing.  HENT:     Head: Normocephalic and atraumatic.  Eyes:     Extraocular Movements: Extraocular movements intact.     Conjunctiva/sclera: Conjunctivae normal.     Pupils: Pupils are equal, round, and reactive to light.  Neck:     Thyroid : No thyromegaly.  Cardiovascular:     Rate and Rhythm: Normal rate and regular rhythm.     Pulses: Normal pulses.     Heart sounds: Normal heart sounds. No murmur heard. Pulmonary:     Effort: Pulmonary effort is normal. No respiratory distress.     Breath sounds: Normal breath sounds.  Abdominal:     General: Bowel sounds are normal. There is no distension.     Palpations: Abdomen is soft.  Musculoskeletal:     Cervical back: Normal range of motion and neck supple.     Right lower leg: No edema.     Left lower leg: No edema.  Lymphadenopathy:     Cervical: No cervical adenopathy.  Skin:    General: Skin is warm and dry.  Neurological:     General: No focal deficit present.     Mental Status: He is alert and oriented to person, place, and time.     Cranial Nerves: No cranial nerve deficit.  Psychiatric:        Mood and Affect: Mood normal.        Behavior: Behavior normal.           Assessment & Plan:

## 2024-02-28 NOTE — Assessment & Plan Note (Signed)
 Deteriorated.  He has gained 10 lbs since last visit.  Encouraged low carb diet and regular physical activity.  Will continue to follow.

## 2024-02-29 DIAGNOSIS — N401 Enlarged prostate with lower urinary tract symptoms: Secondary | ICD-10-CM | POA: Diagnosis not present

## 2024-02-29 DIAGNOSIS — R339 Retention of urine, unspecified: Secondary | ICD-10-CM | POA: Diagnosis not present

## 2024-03-14 DIAGNOSIS — R3914 Feeling of incomplete bladder emptying: Secondary | ICD-10-CM | POA: Diagnosis not present

## 2024-03-14 DIAGNOSIS — N401 Enlarged prostate with lower urinary tract symptoms: Secondary | ICD-10-CM | POA: Diagnosis not present

## 2024-03-19 ENCOUNTER — Other Ambulatory Visit: Payer: Self-pay | Admitting: Family Medicine

## 2024-03-19 DIAGNOSIS — E785 Hyperlipidemia, unspecified: Secondary | ICD-10-CM

## 2024-04-02 ENCOUNTER — Ambulatory Visit

## 2024-04-15 ENCOUNTER — Other Ambulatory Visit: Payer: Self-pay | Admitting: Family Medicine

## 2024-04-15 DIAGNOSIS — E785 Hyperlipidemia, unspecified: Secondary | ICD-10-CM

## 2024-04-23 DIAGNOSIS — R3914 Feeling of incomplete bladder emptying: Secondary | ICD-10-CM | POA: Diagnosis not present

## 2024-04-23 DIAGNOSIS — N401 Enlarged prostate with lower urinary tract symptoms: Secondary | ICD-10-CM | POA: Diagnosis not present

## 2024-04-23 DIAGNOSIS — R8271 Bacteriuria: Secondary | ICD-10-CM | POA: Diagnosis not present

## 2024-04-23 DIAGNOSIS — N3000 Acute cystitis without hematuria: Secondary | ICD-10-CM | POA: Diagnosis not present

## 2024-05-21 DIAGNOSIS — N401 Enlarged prostate with lower urinary tract symptoms: Secondary | ICD-10-CM | POA: Diagnosis not present

## 2024-05-21 DIAGNOSIS — N3000 Acute cystitis without hematuria: Secondary | ICD-10-CM | POA: Diagnosis not present

## 2024-05-21 DIAGNOSIS — R351 Nocturia: Secondary | ICD-10-CM | POA: Diagnosis not present

## 2024-05-21 DIAGNOSIS — R3914 Feeling of incomplete bladder emptying: Secondary | ICD-10-CM | POA: Diagnosis not present

## 2024-07-17 ENCOUNTER — Other Ambulatory Visit: Payer: Self-pay | Admitting: Family Medicine

## 2024-07-17 DIAGNOSIS — E785 Hyperlipidemia, unspecified: Secondary | ICD-10-CM

## 2024-08-06 ENCOUNTER — Other Ambulatory Visit: Payer: Self-pay

## 2024-08-06 MED ORDER — TRIAMCINOLONE ACETONIDE 0.1 % EX CREA: TOPICAL_CREAM | Freq: Two times a day (BID) | CUTANEOUS | 0 refills | Status: DC

## 2024-08-07 ENCOUNTER — Telehealth: Payer: Self-pay | Admitting: Pharmacist

## 2024-08-07 DIAGNOSIS — I10 Essential (primary) hypertension: Secondary | ICD-10-CM

## 2024-08-07 MED ORDER — RAMIPRIL 10 MG PO CAPS: 10.0000 mg | ORAL_CAPSULE | Freq: Every day | ORAL | 0 refills | Status: AC

## 2024-08-07 NOTE — Progress Notes (Signed)
 Pharmacy Quality Measure Review  This patient is appearing on a report for being at risk of failing the adherence measure for hypertension (ACEi/ARB) medications this calendar year.   Medication: ramipril  Last fill date: 02/29/2024 for 90 day supply  Patient has no refills left on ramipril . Will need updated Rx. Sent request to PCP.  Spoke with patient and he endorsed that he is taking ramipril  every day and only has a few capsules left. His next PCP appointment is 09/13/2024.   Madelin Ray, PharmD Clinical Pharmacist [facility]  Population Health [phone]

## 2024-09-02 ENCOUNTER — Other Ambulatory Visit: Payer: Self-pay | Admitting: Family Medicine

## 2024-09-13 ENCOUNTER — Ambulatory Visit (INDEPENDENT_AMBULATORY_CARE_PROVIDER_SITE_OTHER): Admitting: Family Medicine

## 2024-09-13 ENCOUNTER — Encounter: Payer: Self-pay | Admitting: Family Medicine

## 2024-09-13 VITALS — BP 138/80 | HR 70 | Ht 71.0 in | Wt 210.0 lb

## 2024-09-13 DIAGNOSIS — I10 Essential (primary) hypertension: Secondary | ICD-10-CM

## 2024-09-13 DIAGNOSIS — Z0000 Encounter for general adult medical examination without abnormal findings: Secondary | ICD-10-CM

## 2024-09-13 LAB — CBC WITH DIFFERENTIAL/PLATELET
Basophils Absolute: 0 K/uL (ref 0.0–0.1)
Basophils Relative: 0.3 % (ref 0.0–3.0)
Eosinophils Absolute: 0.2 K/uL (ref 0.0–0.7)
Eosinophils Relative: 4.1 % (ref 0.0–5.0)
HCT: 42.4 % (ref 39.0–52.0)
Hemoglobin: 14.4 g/dL (ref 13.0–17.0)
Lymphocytes Relative: 41.3 % (ref 12.0–46.0)
Lymphs Abs: 2 K/uL (ref 0.7–4.0)
MCHC: 33.8 g/dL (ref 30.0–36.0)
MCV: 88.7 fl (ref 78.0–100.0)
Monocytes Absolute: 0.3 K/uL (ref 0.1–1.0)
Monocytes Relative: 7.1 % (ref 3.0–12.0)
Neutro Abs: 2.3 K/uL (ref 1.4–7.7)
Neutrophils Relative %: 47.2 % (ref 43.0–77.0)
Platelets: 190 K/uL (ref 150.0–400.0)
RBC: 4.78 Mil/uL (ref 4.22–5.81)
RDW: 14.5 % (ref 11.5–15.5)
WBC: 4.9 K/uL (ref 4.0–10.5)

## 2024-09-13 LAB — BASIC METABOLIC PANEL WITH GFR
BUN: 30 mg/dL — ABNORMAL HIGH (ref 6–23)
CO2: 30 meq/L (ref 19–32)
Calcium: 9.4 mg/dL (ref 8.4–10.5)
Chloride: 105 meq/L (ref 96–112)
Creatinine, Ser: 1.16 mg/dL (ref 0.40–1.50)
GFR: 55.99 mL/min — ABNORMAL LOW
Glucose, Bld: 93 mg/dL (ref 70–99)
Potassium: 5 meq/L (ref 3.5–5.1)
Sodium: 140 meq/L (ref 135–145)

## 2024-09-13 LAB — LIPID PANEL
Cholesterol: 200 mg/dL (ref 28–200)
HDL: 43.1 mg/dL
LDL Cholesterol: 127 mg/dL — ABNORMAL HIGH (ref 10–99)
NonHDL: 156.8
Total CHOL/HDL Ratio: 5
Triglycerides: 148 mg/dL (ref 10.0–149.0)
VLDL: 29.6 mg/dL (ref 0.0–40.0)

## 2024-09-13 LAB — HEPATIC FUNCTION PANEL
ALT: 18 U/L (ref 3–53)
AST: 18 U/L (ref 5–37)
Albumin: 4.1 g/dL (ref 3.5–5.2)
Alkaline Phosphatase: 61 U/L (ref 39–117)
Bilirubin, Direct: 0.1 mg/dL (ref 0.1–0.3)
Total Bilirubin: 0.4 mg/dL (ref 0.2–1.2)
Total Protein: 7.1 g/dL (ref 6.0–8.3)

## 2024-09-13 NOTE — Patient Instructions (Signed)
 Follow up in 6 months to recheck blood pressure and cholesterol We'll notify you of your lab results and make any changes if needed Keep up the good work!  You look great! Call with any questions or concerns Stay Safe!  Stay Healthy! HAPPY NEW YEAR!

## 2024-09-13 NOTE — Progress Notes (Signed)
" ° °  Subjective:    Patient ID: Jack Randolph, male    DOB: December 19, 1932, 89 y.o.   MRN: [id number]  HPI CPE- UTD on Tdap, PNA, flu  Health Maintenance  Topic Date Due   Medicare Annual Wellness (AWV)  03/22/2024   COVID-19 Vaccine (6 - 2025-26 season) 09/29/2024 (Originally 04/22/2024)   DTaP/Tdap/Td (2 - Td or Tdap) 12/22/2031   Pneumococcal Vaccine: 50+ Years  Completed   Influenza Vaccine  Completed   Zoster Vaccines- Shingrix  Completed   Meningococcal B Vaccine  Aged Out    Patient Care Team    Relationship Specialty Notifications Start End  Mahlon Comer BRAVO, MD PCP - General Family Medicine  12/19/16   Devere Lonni Righter, MD Consulting Physician Urology  09/22/20       Review of Systems Patient reports no vision changes, anorexia, fever ,adenopathy, persistant/recurrent hoarseness, swallowing issues, chest pain, palpitations, edema, persistant/recurrent cough, hemoptysis, dyspnea (rest,exertional, paroxysmal nocturnal), gastrointestinal  bleeding (melena, rectal bleeding), abdominal pain, excessive heart burn, GU symptoms (dysuria, hematuria, voiding/incontinence issues) syncope, focal weakness, memory loss, numbness & tingling, skin/hair/nail changes, depression, anxiety, abnormal bruising/bleeding, musculoskeletal symptoms/signs.   + hearing loss    Objective:   Physical Exam General Appearance:    Alert, cooperative, no distress, appears stated age  Head:    Normocephalic, without obvious abnormality, atraumatic  Eyes:    PERRL, conjunctiva/corneas clear, EOM's intact both eyes       Ears:    Normal TM's and external ear canals, both ears  Nose:   Nares normal, septum midline, mucosa normal, no drainage   or sinus tenderness  Throat:   Lips, mucosa, and tongue normal; teeth and gums normal  Neck:   Supple, symmetrical, trachea midline, no adenopathy;       thyroid :  No enlargement/tenderness/nodules  Back:     Symmetric, no curvature, ROM normal, no CVA  tenderness  Lungs:     Clear to auscultation bilaterally, respirations unlabored  Chest wall:    No tenderness or deformity  Heart:    Regular rate and rhythm, S1 and S2 normal, no murmur, rub   or gallop  Abdomen:     Soft, non-tender, bowel sounds active all four quadrants,    no masses, no organomegaly  Genitalia:    deferred  Rectal:    Extremities:   Extremities normal, atraumatic, no cyanosis, trace edema bilaterally  Pulses:   2+ and symmetric all extremities  Skin:   Skin color, texture, turgor normal, no rashes or lesions  Lymph nodes:   Cervical, supraclavicular, and axillary nodes normal  Neurologic:   CNII-XII intact. Normal strength, sensation and reflexes      throughout          Assessment & Plan:    "

## 2024-09-13 NOTE — Assessment & Plan Note (Signed)
 Pt's PE WNL.  UTD on Tdap, PNA, flu.  No longer doing PSA or colonoscopy.  Check labs.  Anticipatory guidance provided.

## 2024-09-14 LAB — TSH: TSH: 1.45 m[IU]/L (ref 0.40–4.50)

## 2024-09-17 ENCOUNTER — Ambulatory Visit: Payer: Self-pay | Admitting: Family Medicine
